# Patient Record
Sex: Male | Born: 1995 | Race: White | Hispanic: No | Marital: Single | Smoking: Never smoker
Health system: Southern US, Community
[De-identification: ages and names within clinical notes are randomized; demographics above are authoritative.]

## PROBLEM LIST (undated history)

## (undated) HISTORY — PX: ORIF WRIST FRACTURE: SHX2133

---
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
## 2015-02-02 ENCOUNTER — Emergency Department (HOSPITAL_COMMUNITY)
Admission: EM | Admit: 2015-02-02 | Discharge: 2015-02-03 | Disposition: A | Discharge status: Home / Self Care | Payer: Federal, State, Local not specified - PPO | Attending: Emergency Medicine | Admitting: Emergency Medicine

## 2015-02-02 ENCOUNTER — Encounter (HOSPITAL_COMMUNITY): Payer: Self-pay | Admitting: Emergency Medicine

## 2015-02-02 DIAGNOSIS — F1210 Cannabis abuse, uncomplicated: Secondary | ICD-10-CM | POA: Diagnosis not present

## 2015-02-02 DIAGNOSIS — Z720 Tobacco use: Secondary | ICD-10-CM | POA: Insufficient documentation

## 2015-02-02 DIAGNOSIS — F419 Anxiety disorder, unspecified: Secondary | ICD-10-CM | POA: Insufficient documentation

## 2015-02-02 DIAGNOSIS — F1410 Cocaine abuse, uncomplicated: Secondary | ICD-10-CM | POA: Insufficient documentation

## 2015-02-02 DIAGNOSIS — F1310 Sedative, hypnotic or anxiolytic abuse, uncomplicated: Secondary | ICD-10-CM | POA: Insufficient documentation

## 2015-02-02 DIAGNOSIS — F911 Conduct disorder, childhood-onset type: Secondary | ICD-10-CM | POA: Insufficient documentation

## 2015-02-02 DIAGNOSIS — F1020 Alcohol dependence, uncomplicated: Secondary | ICD-10-CM | POA: Insufficient documentation

## 2015-02-02 DIAGNOSIS — Z046 Encounter for general psychiatric examination, requested by authority: Secondary | ICD-10-CM | POA: Diagnosis present

## 2015-02-02 LAB — ETHANOL: ALCOHOL ETHYL (B): 128 mg/dL — AB (ref <5)

## 2015-02-02 LAB — COMPREHENSIVE METABOLIC PANEL
ALK PHOS: 112 U/L (ref 38–126)
ALT: 16 U/L — ABNORMAL LOW (ref 17–63)
AST: 21 U/L (ref 15–41)
Albumin: 4.3 g/dL (ref 3.5–5.0)
Anion gap: 11 (ref 5–15)
BUN: 11 mg/dL (ref 6–20)
CO2: 22 mmol/L (ref 22–32)
Calcium: 9 mg/dL (ref 8.9–10.3)
Chloride: 110 mmol/L (ref 101–111)
Creatinine, Ser: 1.21 mg/dL (ref 0.61–1.24)
GFR calc Af Amer: >60 mL/min (ref >60)
GFR calc non Af Amer: >60 mL/min (ref >60)
Glucose, Bld: 81 mg/dL (ref 65–99)
POTASSIUM: 3.7 mmol/L (ref 3.5–5.1)
Sodium: 143 mmol/L (ref 135–145)
TOTAL PROTEIN: 7.8 g/dL (ref 6.5–8.1)
Total Bilirubin: 1 mg/dL (ref 0.3–1.2)

## 2015-02-02 LAB — RAPID URINE DRUG SCREEN, HOSP PERFORMED
AMPHETAMINES: NOT DETECTED
Barbiturates: NOT DETECTED
Benzodiazepines: POSITIVE — AB
COCAINE: POSITIVE — AB
Opiates: NOT DETECTED
Tetrahydrocannabinol: POSITIVE — AB

## 2015-02-02 LAB — CBC
HCT: 42.4 % (ref 39.0–52.0)
Hemoglobin: 15.1 g/dL (ref 13.0–17.0)
MCH: 32.3 pg (ref 26.0–34.0)
MCHC: 35.6 g/dL (ref 30.0–36.0)
MCV: 90.6 fL (ref 78.0–100.0)
Platelets: 267 10*3/uL (ref 150–400)
RBC: 4.68 MIL/uL (ref 4.22–5.81)
RDW: 12.1 % (ref 11.5–15.5)
WBC: 8.8 10*3/uL (ref 4.0–10.5)

## 2015-02-02 LAB — ACETAMINOPHEN LEVEL: Acetaminophen (Tylenol), Serum: <10 ug/mL — ABNORMAL LOW (ref 10–30)

## 2015-02-02 LAB — SALICYLATE LEVEL: Salicylate Lvl: <4 mg/dL (ref 2.8–30.0)

## 2015-02-02 MED ORDER — ONDANSETRON HCL 4 MG PO TABS: 4.0000 mg | ORAL_TABLET | Freq: Three times a day (TID) | ORAL | Status: DC | PRN

## 2015-02-02 MED ORDER — ZIPRASIDONE MESYLATE 20 MG IM SOLR: 10.0000 mg | Freq: Once | INTRAMUSCULAR | Status: AC | PRN

## 2015-02-02 MED ORDER — ACETAMINOPHEN 325 MG PO TABS: 650.0000 mg | ORAL_TABLET | ORAL | Status: DC | PRN

## 2015-02-02 NOTE — ED Notes (Signed)
Pt brought in by sherriff  Paperwork is currently being taken out by sherriff  Parents called them today because pt took a knife out of a drawer in the kitchen and said he wanted to shoot himself  Pt denies wanting to hurt others but states he does want to hurt himself  Pt is on probation for a DUI  Pt had beer and xanax and another bottle of pills in his room  Parents want him to go to rehab but pt does not want to go   Pt admits to drinking 3 beers today  Pt has an appt with Dr Huston FoleyBrady on 7/13 at 11:15

## 2015-02-02 NOTE — BH Assessment (Addendum)
Tele Assessment Note   Luis George is an 19 y.o. single male who was brought to [facility] by [facility]. after a call from his parents regarding his aggressive behavior and suicidal statements made to them today. Pt is IVC'd.  Information for this assessment was obtained from [facility] notes and the pt.  Pt would not give consent to release or obtain information from his parents.  Pt stated that he was upset that his parents had called law enforcement as he is scared he is going to jail. As documented per parents, they reported to law enforcement  pt made suicidal statements to them including "I will get a gun and do it." Pt stated during the assessment that he would never actually kill himself but that he "hates his life" and that he is "not himself."  Pt would not give details on how/why he was not feeling himself or hates his life. Pt was displaying paranoid behavior and making paranoid statement throughout the assessment concerning "holding this information against me" and "this information being part of my permanent record." Pt reported that he has 3 charges related to substance use, one of which is a DUI for which he is on probation.  Pt was expressing tangential thinking throughout concerning how the assessment information was to be used and shared.  This assessor explained several times about his confidentiality rights and that there was no sharing of information without his consent (with specific exceptions none of which applied). Pt continually returned to the same questions and comments regarding these concerns. Pt reported that today he "became upset" at his parents' home without being willing to disclose what he was upset about. Pt reported that his father "addressed my behavior" and then "it escalated." Pt reported that he and his father "have no relationship" and that " he doesn't show affection to me like he does my siblings." Pt reported that he was also upset because he had broken up with his GF  "within the last few weeks" but would not discuss the reason for the breakup. Pt stated that though he broke up with his GF he still "has feelings for her" and this is adding to his upset state. Pt denies SI, HI, SHI and AVH.  Pt denies any abuse including physical, sexual or emotional/verbal. Pt states that he uses alcohol and marijuana regularly although he denies daily use of either.  Pt stated that he uses both "socially only." Pt stated that he uses Xanax which he states he is not prescribed "every once in a while" but could not identify a time frame for use.  Pt stated that he used cocaine today but that today was his first use. Today pt tested + for cannabis, benzos and cocaine on his UDS and ETOH = 128.   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 Pt reported that he is on the Dean's List at school. Pt stated that he has never been IP for MH reasons or had any OPT.  Pt reported that his parents had made him an appointment this coming Wednesday with a therapist to begin OPT. Pt reported that he has several charges related to substance use including one DUI and is currently on unsupervised probation. Pt states he gets 6 + hours of sleep at night and has not lost or gained any weight in the last few weeks.   Pt was alert, cooperative, polite and very tearful  during the assessment. Pt verbally challenged this assessor many times during the assessment and then, quickly voiced that he was cooperating and being polite.  Pt's speech was rapid and pressured.  Pt's movements were restless and somewhat iratic throughout.  Pt's thought processes while coherent and relevant also displayed tangential thinking as pt continually returned to paranoia over how the information he was giving would be used, whether or not he was going to jail and why I was asking the information for the assessment.  All of these topics were discussed with him in the  beginning of the assessment and repeatedly throughout whenever he would ask again. Pt's mood was depressed, anxious (paranoid) and irritable and his constricted, tearful, frightened affect was congruent. Pt was oriented x 4.   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 Moderate; 303.90 Alcohol Use Disorder, Moderate Axis II: Deferred Axis III: No past medical history on file. Axis IV: other psychosocial or environmental problems, problems related to legal system/crime, problems related to social environment and problems with primary support group Axis V: 11-20 some danger of hurting self or others possible OR occasionally fails to maintain minimal personal hygiene OR gross impairment in communication  Past Medical History: No past medical history on file.  Past Surgical History  Procedure Laterality Date  . Orif wrist fracture      Family History: History reviewed. No pertinent family history.  Social History:  reports that he has been smoking Cigarettes.  He does not have any smokeless tobacco history on file. He reports that he drinks alcohol. He reports that he uses illicit drugs.  Additional Social History:  Alcohol / Drug Use Prescriptions: See PTA list History of alcohol / drug use?: Yes (Pt was very paranoid and difficult to obtain information from due to his paranoia) Longest period of sobriety (when/how long): "a long time" pt could not give specific time frame Substance #1 Name of Substance 1: Alcohol 1 - Age of First Use: 15 1 - Amount (size/oz): 3 beers or more 1 - Frequency: 2-3 times a month / socially 1 - Duration: since 15 1 - Last Use / Amount: today Substance #2 Name of Substance 2: Marijuana 2 - Age of First Use: "high school" 2 - Amount (size/oz): unknown- pt would not give amt 2 - Frequency: "I use it socially.  I cannot tell you exactly when" 2 - Duration: since high school 2 - Last Use /  Amount: Today Substance #3 Name of Substance 3: Cocaine 3 - Age of First Use: 18 3 - Amount (size/oz): unknown to pt 3 - Frequency: first time per pt 3 - Duration: today only per pt 3 - Last Use / Amount: today Substance #4 Name of Substance 4: Xanax 4 - Age of First Use: unknown- pt would not answer 4 - Amount (size/oz): unknown 4 - Frequency: unknown 4 - Duration: unknown 4 - Last Use / Amount: today  CIWA: CIWA-Ar BP: 122/60 mmHg Pulse Rate: 98 COWS:    PATIENT STRENGTHS: (choose at least two) Average or above average intelligence Capable of independent living Communication skills Physical Health Supportive family/friends  Allergies: No Known Allergies  Home Medications:  (Not in a hospital admission)  OB/GYN Status:  No LMP for male patient.  General Assessment Data Location of Assessment: [facility] ED TTS Assessment: In system Is this a Tele or Face-to-Face Assessment?: Tele Assessment Is this an Initial Assessment or a Re-assessment for this encounter?: Initial Assessment Marital  status: Single Maiden name: na Is patient pregnant?: No Pregnancy Status: No Living Arrangements: Parent (Parents in summer; College during school year) Can pt return to current living arrangement?: Yes Admission Status: Involuntary Is patient capable of signing voluntary admission?:  (Pt is IVC) Referral Source: Self/Family/Friend Insurance type: Scientist, research (physical sciences)BCBS  Medical Screening Exam [facility] Walk-in ONLY) Medical Exam completed: Yes  Crisis Care Plan Living Arrangements: Parent (Parents in summer; College during school year) Name of Psychiatrist: none Name of Therapist: none  Education Status Is patient currently in school?: Yes Current Grade:  (college) Highest grade of school patient has completed: 12 (plus some college) Name of school: [employer] Contact person: na  Risk to self with the past 6 months Suicidal Ideation: Yes-Currently Present Has patient been a risk to self  within the past 6 months prior to admission? : Yes Suicidal Intent: Yes-Currently Present Has patient had any suicidal intent within the past 6 months prior to admission? : Yes Is patient at risk for suicide?: Yes Suicidal Plan?: No (denies a plan) Has patient had any suicidal plan within the past 6 months prior to admission? : No Access to Means:  (unknown pt would not answer) What has been your use of drugs/alcohol within the last 12 months?: regular use of multiple substances Previous Attempts/Gestures: No (denies) How many times?: 0 Other Self Harm Risks: none noted Triggers for Past Attempts:  (na) Intentional Self Injurious Behavior: None Family Suicide History: Unknown Recent stressful life event(s): Conflict (Comment), Loss (Comment) (loss of GF; Conflict with Dad) Persecutory voices/beliefs?: No (denies) Depression: Yes Depression Symptoms: Tearfulness, Fatigue, Guilt, Loss of interest in usual pleasures, Feeling worthless/self pity, Feeling angry/irritable Substance abuse history and/or treatment for substance abuse?: Yes Suicide prevention information given to non-admitted patients: Not applicable  Risk to Others within the past 6 months Homicidal Ideation: No (denies) Does patient have any lifetime risk of violence toward others beyond the six months prior to admission? : No Thoughts of Harm to Others: No (denies) Current Homicidal Intent: No (denies) Current Homicidal Plan: No (denies) Access to Homicidal Means:  (unknown ) Identified Victim: na History of harm to others?: No (denies) Assessment of Violence: None Noted Violent Behavior Description: na Does patient have access to weapons?:  (unknown) Criminal Charges Pending?: No Does patient have a court date: Yes Is patient on probation?: Yes (for DUI and other substance related charges)  Psychosis Hallucinations: None noted Delusions: None noted  Mental Status Report Appearance/Hygiene: In scrubs,  Unremarkable Eye Contact: Good Motor Activity: Agitation, Restlessness Speech: Rapid, Pressured, Logical/coherent, Loud Level of Consciousness: Alert, Restless, Crying (Argumentative; Paranoid) Mood: Depressed, Anxious, Suspicious, Apprehensive, Ashamed/humiliated, Irritable, Terrified, Fearful Affect: Irritable, Constricted, Anxious, Apprehensive, Depressed, Frightened Anxiety Level: Moderate Thought Processes: Coherent, Relevant, Tangential (thoughts continually going to whether he was going to jail) Judgement: Impaired Orientation: Person, Place, Time, Situation Obsessive Compulsive Thoughts/Behaviors: None  Cognitive Functioning Concentration: Poor Memory: Recent Impaired, Remote Intact IQ: Average Insight: Poor Impulse Control: Poor Appetite: Good Weight Loss: 0 Weight Gain: 0 Sleep: No Change Total Hours of Sleep: 6 Vegetative Symptoms: None  ADLScreening [facility] Assessment Services) Patient's cognitive ability adequate to safely complete daily activities?: Yes Patient able to express need for assistance with ADLs?: Yes Independently performs ADLs?: Yes (appropriate for developmental age)  Prior Inpatient Therapy Prior Inpatient Therapy: No Prior Therapy Dates: na Prior Therapy Facilty/Provider(s): na Reason for Treatment: na  Prior Outpatient Therapy Prior Outpatient Therapy: No Prior Therapy Dates: na Prior Therapy Facilty/Provider(s): na Reason for Treatment:  na Does patient have an ACCT team?: No Does patient have Intensive In-House Services?  : No Does patient have [facility] services? : No Does patient have P4CC services?: No  ADL Screening (condition at time of admission) Patient's cognitive ability adequate to safely complete daily activities?: Yes Patient able to express need for assistance with ADLs?: Yes Independently performs ADLs?: Yes (appropriate for developmental age)       Abuse/Neglect Assessment (Assessment to be complete while patient is  alone) Physical Abuse: Denies Verbal Abuse: Denies Sexual Abuse: Denies     Advance Directives (For Healthcare) Does patient have an advance directive?: No Would patient like information on creating an advanced directive?: No - patient declined information    Additional Information 1:1 In Past 12 Months?: No CIRT Risk: No Elopement Risk: No Does patient have medical clearance?: Yes     Disposition:  Disposition Initial Assessment Completed for this Encounter: Yes Disposition of Patient: Other dispositions (Pending review w [facility] Extender) Other disposition(s): Other (Comment)  Per Donell Sievert, PA: Meets IP criteiria. Dual Diagnosis (SA and MDD Per Clint Bolder, [facility]: No appropriate beds at [facility]; TTS will seek outside placement.   Spoke with Elpidio Anis, PA-C: Advised of Recommendation.  She agreed.   Beryle Flock, MS, CRC, [facility] Triage Specialist [facility] T 02/02/2015 10:27 PM

## 2015-02-02 NOTE — ED Notes (Signed)
Telepsych in progress. 

## 2015-02-02 NOTE — ED Provider Notes (Signed)
CSN: [id number]     Arrival date & time 02/02/15  1957 History  This chart was scribed for non-physician practitioner, Elpidio Anis, PA-C, working with Elwin Mocha, MD by Evon Slack, ED Scribe. This patient was seen in room WTR4/WLPT4 and the patient's care was started at 8:43 PM.    Chief Complaint  Patient presents with  . IVC   . Suicidal   The history is provided by the patient. No language interpreter was used.  HPI Comments: Luis George is a 19 y.o. male brought in by [employer] [employer], who presents to the Emergency under IVC paperwork for aggressive behavior and suicide statements. IVC paperwork implemented by [employer] after being called to the home for the second time with complaints of aggressive behavior towards his parents. Complaint by parents of alcohol dependence issue. Today pt has been makins suicidal statements "I will get a gun and do it."   No past medical history on file. Past Surgical History  Procedure Laterality Date  . Orif wrist fracture     History reviewed. No pertinent family history. History  Substance Use Topics  . Smoking status: Current Some Day Smoker    Types: Cigarettes  . Smokeless tobacco: Not on file  . Alcohol Use: Yes    Review of Systems  All other systems reviewed and are negative.    Allergies  Review of patient's allergies indicates no known allergies.  Home Medications   Prior to Admission medications   Medication Sig Start Date End Date Taking? Authorizing Provider  diphenhydrAMINE (BENADRYL) 25 mg capsule Take 25 mg by mouth daily as needed for allergies.   Yes Historical Provider, MD   BP 122/60 mmHg  Pulse 98  Temp(Src) 98 F (36.7 C) (Oral)  Resp 20  SpO2 100%   Physical Exam  Constitutional: He is oriented to person, place, and time. He appears well-developed and well-nourished. No distress.  HENT:  Head: Normocephalic and atraumatic.  Eyes: Conjunctivae and EOM are normal.  Neck: Neck  supple. No tracheal deviation present.  Cardiovascular: Normal rate, regular rhythm and normal heart sounds.   Pulmonary/Chest: Effort normal and breath sounds normal. No respiratory distress. He has no wheezes. He has no rales.  Abdominal: Soft. There is no tenderness.  Musculoskeletal: Normal range of motion.  Neurological: He is alert and oriented to person, place, and time.  Skin: Skin is warm and dry.  Psychiatric: His speech is normal. His mood appears anxious. He is agitated. He expresses suicidal ideation. He expresses no homicidal ideation.  Nursing note and vitals reviewed.   ED Course  Procedures (including critical care time) DIAGNOSTIC STUDIES: Oxygen Saturation is 100% on RA, normal by my interpretation.    COORDINATION OF CARE:    Labs Review Labs Reviewed  URINE RAPID DRUG SCREEN, HOSP PERFORMED - Abnormal; Notable for the following:    Cocaine POSITIVE (*)    Benzodiazepines POSITIVE (*)    Tetrahydrocannabinol POSITIVE (*)    All other components within normal limits  CBC  COMPREHENSIVE METABOLIC PANEL  ETHANOL  SALICYLATE LEVEL  ACETAMINOPHEN LEVEL    Imaging Review No results found.   EKG Interpretation None      MDM   Final diagnoses:  None   1. Suicidal ideation, under IVC 2. Aggression 2. Polysubstance abuse  IVC petition in place. Waiting for TTS consultation to determine disposition.   I personally performed the services described in this documentation, which was scribed in my presence. The recorded information  has been reviewed and is accurate.        Elpidio AnisShari Katreena Schupp, PA-C 02/02/15 2125  Elwin MochaBlair Walden, MD 02/02/15 [phone]

## 2015-02-03 ENCOUNTER — Inpatient Hospital Stay (HOSPITAL_COMMUNITY)
Admission: AD | Admit: 2015-02-03 | Discharge: 2015-02-07 | DRG: 881 | Disposition: A | Discharge status: Home / Self Care | Payer: Federal, State, Local not specified - PPO | Source: Intra-hospital | Attending: Emergency Medicine | Admitting: Emergency Medicine

## 2015-02-03 ENCOUNTER — Encounter (HOSPITAL_COMMUNITY): Payer: Self-pay | Admitting: Emergency Medicine

## 2015-02-03 DIAGNOSIS — R45851 Suicidal ideations: Secondary | ICD-10-CM | POA: Diagnosis present

## 2015-02-03 DIAGNOSIS — F1910 Other psychoactive substance abuse, uncomplicated: Secondary | ICD-10-CM | POA: Diagnosis present

## 2015-02-03 DIAGNOSIS — R569 Unspecified convulsions: Secondary | ICD-10-CM | POA: Diagnosis present

## 2015-02-03 DIAGNOSIS — F1994 Other psychoactive substance use, unspecified with psychoactive substance-induced mood disorder: Secondary | ICD-10-CM | POA: Diagnosis present

## 2015-02-03 DIAGNOSIS — F419 Anxiety disorder, unspecified: Secondary | ICD-10-CM | POA: Diagnosis present

## 2015-02-03 DIAGNOSIS — F1020 Alcohol dependence, uncomplicated: Secondary | ICD-10-CM | POA: Diagnosis present

## 2015-02-03 DIAGNOSIS — F4323 Adjustment disorder with mixed anxiety and depressed mood: Secondary | ICD-10-CM | POA: Diagnosis present

## 2015-02-03 DIAGNOSIS — F329 Major depressive disorder, single episode, unspecified: Secondary | ICD-10-CM | POA: Diagnosis not present

## 2015-02-03 DIAGNOSIS — R55 Syncope and collapse: Secondary | ICD-10-CM

## 2015-02-03 MED ORDER — TRAZODONE HCL 50 MG PO TABS
50.0000 mg | ORAL_TABLET | Freq: Every evening | ORAL | Status: DC | PRN
  Filled 2015-02-03: qty 1

## 2015-02-03 MED ORDER — ADULT MULTIVITAMIN W/MINERALS CH: 1.0000 | ORAL_TABLET | Freq: Every day | ORAL | Status: DC

## 2015-02-03 MED ORDER — ONDANSETRON 4 MG PO TBDP: 4.0000 mg | ORAL_TABLET | Freq: Four times a day (QID) | ORAL | Status: DC | PRN

## 2015-02-03 MED ORDER — LORAZEPAM 1 MG PO TABS: 1.0000 mg | ORAL_TABLET | Freq: Four times a day (QID) | ORAL | Status: DC | PRN

## 2015-02-03 MED ORDER — ALUM & MAG HYDROXIDE-SIMETH 200-200-20 MG/5ML PO SUSP: 30.0000 mL | ORAL | Status: DC | PRN

## 2015-02-03 MED ORDER — LOPERAMIDE HCL 2 MG PO CAPS: 2.0000 mg | ORAL_CAPSULE | ORAL | Status: DC | PRN

## 2015-02-03 MED ORDER — VITAMIN B-1 100 MG PO TABS: 100.0000 mg | ORAL_TABLET | Freq: Every day | ORAL | Status: DC

## 2015-02-03 MED ORDER — THIAMINE HCL 100 MG/ML IJ SOLN: 100.0000 mg | Freq: Once | INTRAMUSCULAR | Status: DC

## 2015-02-03 MED ORDER — CHLORDIAZEPOXIDE HCL 25 MG PO CAPS: 25.0000 mg | ORAL_CAPSULE | Freq: Four times a day (QID) | ORAL | Status: DC | PRN

## 2015-02-03 MED ORDER — MAGNESIUM HYDROXIDE 400 MG/5ML PO SUSP: 30.0000 mL | Freq: Every day | ORAL | Status: DC | PRN

## 2015-02-03 MED ORDER — HYDROXYZINE HCL 25 MG PO TABS: 25.0000 mg | ORAL_TABLET | Freq: Four times a day (QID) | ORAL | Status: DC | PRN

## 2015-02-03 MED ORDER — ACETAMINOPHEN 325 MG PO TABS: 650.0000 mg | ORAL_TABLET | Freq: Four times a day (QID) | ORAL | Status: DC | PRN

## 2015-02-03 MED ORDER — HYDROXYZINE HCL 25 MG PO TABS
25.0000 mg | ORAL_TABLET | Freq: Four times a day (QID) | ORAL | Status: DC | PRN
Start: 2015-02-03 — End: 2015-02-06
  Administered 2015-02-05: 25 mg via ORAL
  Filled 2015-02-03: qty 1

## 2015-02-03 NOTE — BH Assessment (Signed)
BHH Assessment Progress Note  Per Thedore MinsMojeed Akintayo, MD, this pt requires psychiatric hospitalization at this time.  Berneice Heinrichina Tate, RN, [facility].  This pt is under IVC and commitment paperwork [facility]  Pt's nurse has been notified, and agrees to call report to [phone].  [facility].  Doylene Canninghomas Cathlyn Tersigni, MA Triage Specialist [phone]

## 2015-02-03 NOTE — BHH Counselor (Signed)
This patient's information was faxed out to the following facilities for review:  [facility]    Vale Peraza [place], [place] Counselor

## 2015-02-03 NOTE — Consult Note (Signed)
[facility] Psychiatry Consult   Reason for Consult:  Alcohol use disorder, Moderate, Suicidal ideation Referring Physician:  EDP Patient Identification: Luis George MRN:  [id number] Principal Diagnosis: Alcohol use disorder, severe, dependence Diagnosis:   Patient Active Problem List   Diagnosis Date Noted  . Alcohol use disorder, severe, dependence [F10.20] 02/03/2015    Priority: High    Total Time spent with patient: 45 minutes  Subjective:   Luis George is a 19 y.o. male patient admitted with ** Alcohol use disorder, moderate, Suicidal ideation. , HPI:  Caucasian male, 19 years old was evaluated for Alcohol use disorder and suicidal ideation with a knife to cut himself.  Patient was IVC by his parents and is under Probation for DUI.  Patient admitted to using Alcohol, Benzodiazepine, Cocaine and Marijuana.  His UDS is positive for Cocaine, Marijuana and Benzodiazepine and his Alcohol level was 128.  Patient denied wanting to kill himself or anybody but admitted to grabbing a knife.  He admitted to using Xanax bought off the street.  He reports breaking up with his girl friend and have been dealing with the situation with Alcohol and other substances.  Patient has been accepted at [facility] and has a bed assigned.  He will be transferred as soon as soon as transportation is available.  HPI Elements:   Location:  Alcohol use disorder, moderate, Suicidal ideation, . Quality:  severe. Severity:  severe. Timing:  acute. Duration:  sudden. Context:  IVC by his parents,.  Past Medical History: No past medical history on file.  Past Surgical History  Procedure Laterality Date  . Orif wrist fracture     Family History: History reviewed. No pertinent family history. Social History:  History  Alcohol Use  . Yes     History  Drug Use  . Yes    Comment: xanax    History   Social History  . Marital Status: Single    Spouse Name: N/A  . Number of Children: N/A  . Years of  Education: N/A   Social History Main Topics  . Smoking status: Current Some Day Smoker    Types: Cigarettes  . Smokeless tobacco: Not on file  . Alcohol Use: Yes  . Drug Use: Yes     Comment: xanax  . Sexual Activity: Not on file   Other Topics Concern  . None   Social History Narrative   Additional Social History:    Prescriptions: See PTA list History of alcohol / drug use?: Yes (Pt was very paranoid and difficult to obtain information from due to his paranoia) Longest period of sobriety (when/how long): "a long time" pt could not give specific time frame Name of Substance 1: Alcohol 1 - Age of First Use: 15 1 - Amount (size/oz): 3 beers or more 1 - Frequency: 2-3 times a month / socially 1 - Duration: since 15 1 - Last Use / Amount: today Name of Substance 2: Marijuana 2 - Age of First Use: "high school" 2 - Amount (size/oz): unknown- pt would not give amt 2 - Frequency: "I use it socially.  I cannot tell you exactly when" 2 - Duration: since high school 2 - Last Use / Amount: Today Name of Substance 3: Cocaine 3 - Age of First Use: 18 3 - Amount (size/oz): unknown to pt 3 - Frequency: first time per pt 3 - Duration: today only per pt 3 - Last Use / Amount: today Name of Substance 4: Xanax 4 - Age of  First Use: unknown- pt would not answer 4 - Amount (size/oz): unknown 4 - Frequency: unknown 4 - Duration: unknown 4 - Last Use / Amount: today             Allergies:  No Known Allergies  Labs:  Results for orders placed or performed during the hospital encounter of 02/02/15 (from the past 48 hour(s))  Urine rapid drug screen (hosp performed)     Status: Abnormal   Collection Time: 02/02/15  8:25 PM  Result Value Ref Range   Opiates NONE DETECTED NONE DETECTED   Cocaine POSITIVE (A) NONE DETECTED   Benzodiazepines POSITIVE (A) NONE DETECTED   Amphetamines NONE DETECTED NONE DETECTED   Tetrahydrocannabinol POSITIVE (A) NONE DETECTED   Barbiturates NONE  DETECTED NONE DETECTED    Comment:        DRUG SCREEN FOR MEDICAL PURPOSES ONLY.  IF CONFIRMATION IS NEEDED FOR ANY PURPOSE, NOTIFY LAB WITHIN 5 DAYS.        LOWEST DETECTABLE LIMITS FOR URINE DRUG SCREEN Drug Class       Cutoff (ng/mL) Amphetamine      1000 Barbiturate      200 Benzodiazepine   163 Tricyclics       845 Opiates          300 Cocaine          300 THC              50   Comprehensive metabolic panel     Status: Abnormal   Collection Time: 02/02/15  8:36 PM  Result Value Ref Range   Sodium 143 135 - 145 mmol/L   Potassium 3.7 3.5 - 5.1 mmol/L   Chloride 110 101 - 111 mmol/L   CO2 22 22 - 32 mmol/L   Glucose, Bld 81 65 - 99 mg/dL   BUN 11 6 - 20 mg/dL   Creatinine, Ser 1.21 0.61 - 1.24 mg/dL   Calcium 9.0 8.9 - 10.3 mg/dL   Total Protein 7.8 6.5 - 8.1 g/dL   Albumin 4.3 3.5 - 5.0 g/dL   AST 21 15 - 41 U/L   ALT 16 (L) 17 - 63 U/L   Alkaline Phosphatase 112 38 - 126 U/L   Total Bilirubin 1.0 0.3 - 1.2 mg/dL   GFR calc non Af Amer >60 >60 mL/min   GFR calc Af Amer >60 >60 mL/min    Comment: (NOTE) The eGFR has been calculated using the CKD EPI equation. This calculation has not been validated in all clinical situations. eGFR's persistently <60 mL/min signify possible Chronic Kidney Disease.    Anion gap 11 5 - 15  Ethanol (ETOH)     Status: Abnormal   Collection Time: 02/02/15  8:36 PM  Result Value Ref Range   Alcohol, Ethyl (B) 128 (H) <5 mg/dL    Comment:        LOWEST DETECTABLE LIMIT FOR SERUM ALCOHOL IS 5 mg/dL FOR MEDICAL PURPOSES ONLY   Salicylate level     Status: None   Collection Time: 02/02/15  8:36 PM  Result Value Ref Range   Salicylate Lvl <3.6 2.8 - 30.0 mg/dL  Acetaminophen level     Status: Abnormal   Collection Time: 02/02/15  8:36 PM  Result Value Ref Range   Acetaminophen (Tylenol), Serum <10 (L) 10 - 30 ug/mL    Comment:        THERAPEUTIC CONCENTRATIONS VARY SIGNIFICANTLY. A RANGE OF 10-30 ug/mL MAY BE AN  EFFECTIVE CONCENTRATION  FOR MANY PATIENTS. HOWEVER, SOME ARE BEST TREATED AT CONCENTRATIONS OUTSIDE THIS RANGE. ACETAMINOPHEN CONCENTRATIONS >150 ug/mL AT 4 HOURS AFTER INGESTION AND >50 ug/mL AT 12 HOURS AFTER INGESTION ARE OFTEN ASSOCIATED WITH TOXIC REACTIONS.   CBC     Status: None   Collection Time: 02/02/15  8:36 PM  Result Value Ref Range   WBC 8.8 4.0 - 10.5 K/uL   RBC 4.68 4.22 - 5.81 MIL/uL   Hemoglobin 15.1 13.0 - 17.0 g/dL   HCT 42.4 39.0 - 52.0 %   MCV 90.6 78.0 - 100.0 fL   MCH 32.3 26.0 - 34.0 pg   MCHC 35.6 30.0 - 36.0 g/dL   RDW 12.1 11.5 - 15.5 %   Platelets 267 150 - 400 K/uL    Vitals: Blood pressure 123/73, pulse 105, temperature 97.5 F (36.4 C), temperature source Oral, resp. rate 18, SpO2 100 %.  Risk to Self: Suicidal Ideation: Yes-Currently Present Suicidal Intent: Yes-Currently Present Is patient at risk for suicide?: Yes Suicidal Plan?: No (denies a plan) Access to Means:  (unknown pt would not answer) What has been your use of drugs/alcohol within the last 12 months?: regular use of multiple substances How many times?: 0 Other Self Harm Risks: none noted Triggers for Past Attempts:  (na) Intentional Self Injurious Behavior: None Risk to Others: Homicidal Ideation: No (denies) Thoughts of Harm to Others: No (denies) Current Homicidal Intent: No (denies) Current Homicidal Plan: No (denies) Access to Homicidal Means:  (unknown ) Identified Victim: na History of harm to others?: No (denies) Assessment of Violence: None Noted Violent Behavior Description: na Does patient have access to weapons?:  (unknown) Criminal Charges Pending?: No Does patient have a court date: Yes Prior Inpatient Therapy: Prior Inpatient Therapy: No Prior Therapy Dates: na Prior Therapy Facilty/Provider(s): na Reason for Treatment: na Prior Outpatient Therapy: Prior Outpatient Therapy: No Prior Therapy Dates: na Prior Therapy Facilty/Provider(s): na Reason for  Treatment: na Does patient have an ACCT team?: No Does patient have Intensive In-House Services?  : No Does patient have [facility] services? : No Does patient have P4CC services?: No  Current Facility-Administered Medications  Medication Dose Route Frequency Provider Last Rate Last Dose  . acetaminophen (TYLENOL) tablet 650 mg  650 mg Oral Q4H PRN Charlann Lange, PA-C      . ondansetron (ZOFRAN) tablet 4 mg  4 mg Oral Q8H PRN Charlann Lange, PA-C       Current Outpatient Prescriptions  Medication Sig Dispense Refill  . diphenhydrAMINE (BENADRYL) 25 mg capsule Take 25 mg by mouth daily as needed for allergies.      Musculoskeletal: Strength & Muscle Tone: within normal limits Gait & Station: normal Patient leans: N/A  Psychiatric Specialty Exam: Physical Exam  Review of Systems  Constitutional: Negative.   HENT: Negative.   Eyes: Negative.   Respiratory: Negative.   Cardiovascular: Negative.   Gastrointestinal: Negative.   Genitourinary: Negative.   Skin: Negative.   Neurological: Negative.   Endo/Heme/Allergies: Negative.     Blood pressure 123/73, pulse 105, temperature 97.5 F (36.4 C), temperature source Oral, resp. rate 18, SpO2 100 %.There is no height or weight on file to calculate BMI.  General Appearance: Casual and Fairly Groomed  Engineer, water::  Good  Speech:  Clear and Coherent and Normal Rate  Volume:  Normal  Mood:  Angry and Anxious  Affect:  Congruent  Thought Process:  Coherent, Goal Directed and Intact  Orientation:  Full (Time, Place, and Person)  Thought Content:  WDL  Suicidal Thoughts:  No  Homicidal Thoughts:  No  Memory:  Immediate;   Good Recent;   Good Remote;   Good  Judgement:  Impaired  Insight:  Shallow  Psychomotor Activity:  Psychomotor Retardation  Concentration:  Poor  Recall:  NA  Fund of Knowledge:Poor  Language: Good  Akathisia:  NA  Handed:  Right  AIMS (if indicated):     Assets:  Desire for Improvement  ADL's:  Intact   Cognition: WNL  Sleep:      Medical Decision Making: Review of Psycho-Social Stressors (1)  Treatment Plan Summary: Daily contact with patient to assess and evaluate symptoms and progress in treatment and Medication management  Plan:  Transfer to [facility], Use Ativan detox protocol for detox. Disposition:  Admitted to [facility]  Delfin Gant 02/03/2015 4:59 PM Patient seen face-to-face for psychiatric evaluation, chart reviewed and case discussed with the physician extender and developed treatment plan. Reviewed the information documented and agree with the treatment plan. Corena Pilgrim, MD

## 2015-02-03 NOTE — Progress Notes (Signed)
Patient ID: Luis George, male   DOB: 01/07/1996, 19 y.o.   MRN: [id number]  Pt is alert and oriented, and appears anxious about being here. He does not think that he needs to be here. He got into an argument with his father and, "It blew up and they were worried bout me."  He denies wanting to kill himself then, or now. He admits that he has "bad anxiety" and a recent break-up with a girlfriend. Pt attends college at [employer]. He drinks socially, has smoked cigarettes on occasion, but does not smoke daily. He has used Cocaine once in the past several years. He does not think that he is addicted, but he does have a DWI. Pt states that he has a close, supportive, family.  The ED nurse reported that pt took a knife out of a drawer and told his parents that he wanted to shoot himself.  She said that his father visited while pt was in the ED, and pt was crying after he left.   Oriented to the unit; Education provided about safety on the unit, including fall prevention. Nutrition offered. Safety checks initiated every 15 minutes.

## 2015-02-03 NOTE — Progress Notes (Signed)
Pt refers frequently to his parents [place] in [place] in [place] Pt states he is home for the summer Pt states he will use information provided for "future reference"  [facility] ED CM spoke with pt on how to obtain an in network pcp with insurance coverage via the customer service number or web site  Cm reviewed ED level of care for crisis/emergent services and community pcp level of care to manage continuous or chronic medical concerns.  The pt voiced understanding CM encouraged pt and discussed pt's responsibility to verify with pt's insurance carrier that any recommended medical provider offered by any emergency room or a hospital provider is within the carrier's network. The pt voiced understanding

## 2015-02-03 NOTE — Progress Notes (Signed)
Pt attended the evening AA speaker meeting. Pt was engaged and appropriate. 

## 2015-02-03 NOTE — Progress Notes (Signed)
Pt is being reviewed by [facility] for potential placement.   Shatina Streets, MSW, LCSW, LCAS Clinical Social Worker [phone] 

## 2015-02-03 NOTE — BH Assessment (Signed)
[facility] pt has been accepted to the wait list. A bed should be available later today (02/03/15)  Clista BernhardtNancy Echo [place], [facility] Triage Specialist 02/03/2015 5:47 AM

## 2015-02-04 ENCOUNTER — Encounter (HOSPITAL_COMMUNITY): Payer: Self-pay | Admitting: Psychiatry

## 2015-02-04 DIAGNOSIS — F1994 Other psychoactive substance use, unspecified with psychoactive substance-induced mood disorder: Secondary | ICD-10-CM

## 2015-02-04 DIAGNOSIS — F4323 Adjustment disorder with mixed anxiety and depressed mood: Secondary | ICD-10-CM

## 2015-02-04 NOTE — BHH Suicide Risk Assessment (Signed)
[facility] Admission Suicide Risk Assessment   Nursing information obtained from:  Patient Demographic factors:  Male, Adolescent or young adult, Caucasian Current Mental Status:  Suicidal ideation indicated by others, Self-harm thoughts, Self-harm behaviors Loss Factors:  Loss of significant relationship Historical Factors:  NA Risk Reduction Factors:  Sense of responsibility to family, Living with another person, especially a relative, Positive social support Total Time spent with patient: 45 minutes Principal Problem: Substance induced mood disorder Diagnosis:   Patient Active Problem List   Diagnosis Date Noted  . Substance induced mood disorder [F19.94] 02/04/2015  . Polysubstance abuse [F19.10] 02/03/2015     Continued Clinical Symptoms:  Alcohol Use Disorder Identification Test Final Score (AUDIT): 7 The "Alcohol Use Disorders Identification Test", Guidelines for Use in Primary Care, Second Edition.  World Science writerHealth Organization [facility](WHO). Score between 0-7:  no or low risk or alcohol related problems. Score between 8-15:  moderate risk of alcohol related problems. Score between 16-19:  high risk of alcohol related problems. Score 20 or above:  warrants further diagnostic evaluation for alcohol dependence and treatment.   CLINICAL FACTORS:   Depression:   Comorbid alcohol abuse/dependence Impulsivity  Psychiatric Specialty Exam: Physical Exam  ROS  Blood pressure 118/89, pulse 85, temperature 97.5 F (36.4 C), resp. rate 16.There is no height or weight on file to calculate BMI.    COGNITIVE FEATURES THAT CONTRIBUTE TO RISK:  None    SUICIDE RISK:   Mild:  Suicidal ideation of limited frequency, intensity, duration, and specificity.  There are no identifiable plans, no associated intent, mild dysphoria and related symptoms, good self-control (both objective and subjective assessment), few other risk factors, and identifiable protective factors, including available and accessible  social support.  PLAN OF CARE: supportive approach/coping skills                               Substance abuse; determine detox needs                                Reassess and address the co morbities  Medical Decision Making:  Review of Psycho-Social Stressors (1), Review or order clinical lab tests (1) and Review of Medication Regimen & Side Effects (2)  I certify that inpatient services furnished can reasonably be expected to improve the patient's condition.   Delany Steury A 02/04/2015, 2:03 PM

## 2015-02-04 NOTE — BHH Counselor (Signed)
Adult Comprehensive Assessment  Patient ID: Luis George, male   DOB: 1996-02-24, 19 y.o.   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) Family Relationships:  (mom- good support, dad- "argue a lot", siblings- good support) Surveyor, quantityinancial / Lack of resources (include bankruptcy):  (denies) Housing / Lack of housing:  (denies- living with parents over the summer and in  apartment at school) Physical health (include injuries & life threatening diseases):  (denies) Social relationships:  (several "volatile" situations with close friends recently causing extreme distress and anxiety) Substance abuse:  (some etoh use; occasional THC and recent (one time ) cocaine use per his report) Bereavement / Loss:  ("break up" with GF and several close friends )  Living/Environment/Situation:  Living Arrangements: Parent Living conditions (as described by patient or guardian):  (living with parents over the summer with plans to return to campus housing in the fall) How long has patient lived in current situation?:  (whole life with parents) What is atmosphere in current home: Other (Comment) (patient reports a lot of arguing with dad- otherwise, good enviornment)  Family History:  Marital status: Single Does patient have children?: No  Childhood History:  By whom was/is the patient raised?: Both parents Additional childhood history information:  (positive- argued a lot with dad growing up; "dad doesn't know how to show his emotions and I'm an emotional person") Description of patient's relationship with caregiver when they were a child:  (good) Patient's description of current relationship with people who raised him/her:  (supportive parents "they love me" but lots of anxiety related to dad and their arguments) Does patient have siblings?:  Yes Number of Siblings: 2 Description of patient's current relationship with siblings:  (both siblings are supportive and understanding of his needs/issues-  "I am a good support to my brother when he has issues with his gifrlfiriend") Did patient suffer any verbal/emotional/physical/sexual abuse as a child?: No Did patient suffer from severe childhood neglect?: No Has patient ever been sexually abused/assaulted/raped as an adolescent or adult?: No Was the patient ever a victim of a crime or a disaster?: No Witnessed domestic violence?: No Has patient been effected by domestic violence as an adult?: No  Education:  Highest grade of school patient has completed:  (Health and safety inspectorrising Junior  in college) Currently a Consulting civil engineerstudent?: Yes Name of school: [employer]  How long has the patient attended?: completed 2 years Learning disability?: No  Employment/Work Situation:   Employment situation: Employed 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 patient been employed?: summer job Patient's job has been impacted by current illness: Yes Describe how patient's job has been impacted:  ( concerned about missing work this week while hospitalized- offered to write a work note for him if needed) What is the longest time patient has a held a job?:  (summer jobs) Has patient ever been in the [employer] and Companymilitary?: No Has patient ever served in Buyer, retailcombat?: No  Financial Resources:   Financial resources: Income from employment Does patient have a representative payee or guardian?: No  Alcohol/Substance Abuse:   What has been your use of drugs/alcohol within the last 12 months?:  (occassional etoh and THC use- one time use of cocaine this week) If attempted suicide, did drugs/alcohol play a role in this?: No Alcohol/Substance Abuse Treatment Hx: Denies past history Has alcohol/substance abuse ever caused legal problems?: No  Social Support System:  Patient's Community Support System: Location manager System:  (parents, siblings, friends) Type of faith/religion: "religious but not active" How does patient's faith help to cope with current illness?: prayer helps  Leisure/Recreation:   Leisure and Hobbies: exercise, soccer, running  Strengths/Needs:   What things does the patient do well?: school, support system, hardworker, good ethics In what areas does patient struggle / problems for patient: emotions, anxiety, worry  Discharge Plan:   Does patient have access to transportation?: Yes Will patient be returning to same living situation after discharge?: Yes Currently receiving community mental health services: No If no, would patient like referral for services when discharged?: Yes (What county?) ([place] and/or [place], [place]) Does patient have financial barriers related to discharge medications?: No  Summary/Recommendations:    Patient was pleasant and engaged throughout assessment. He showed signs of extreme anxiety and worry as well becoming tearful at times. "I haven't even cried since I got here". He reports having a lot of anxiety and worry related to a recent break up (he broke up with his GF of 1 year because of arguing all the time), several close friends becoming upset and accusing him of things based on a picture on social media and an argument with his father.  Patient admits to being "embarrassed" by what has gone on (police cars at parents house to IVC him- friends saw this), letting his "emotions" get the best of him and feeling out of control. CSW listened and attempted to validate his feelings and thoughts while encouraging him to focus on getting the care he needs (now).  He appeared less anxious and reported "talking does make it better" as we ended our visit. He is open to seeing a therapist as an outpatient and reports plans were in place for him to see someone today before this crisis and hospitalization occurred.   Liliana Cline. 02/04/2015

## 2015-02-04 NOTE — Progress Notes (Signed)
D. Pt had been up and visible in milieu this evening, did attend and participate in evening group activity. Pt spoke some about what led to his hospitalization. Pt asked various questions as to the various processes in the hospital and was provided with the answers. Pt has not appeared to be in any acute distress this evening and has not shown any symptoms of withdrawal. A. Support and encouragement provided. R. Safety maintained, will continue to monitor.

## 2015-02-04 NOTE — BHH Group Notes (Signed)
[facility] LCSW Aftercare Discharge Planning Group Note  02/04/2015  8:45 AM  Participation Quality: Did Not Attend. Patient invited to participate but declined.  Gerrard Crystal, MSW, LCSWA Clinical Social Worker [facility] [phone]   

## 2015-02-04 NOTE — [facility] Group Notes (Signed)
[facility] LCSW Group Therapy 02/04/2015  1:15 PM Type of Therapy: Group Therapy Participation Level: Active  Participation Quality: Attentive  Affect: Appropriate  Cognitive: Alert and Oriented  Insight: Developing/Improving and Engaged  Engagement in Therapy: Developing/Improving and Engaged  Modes of Intervention: Clarification, Confrontation, Discussion, Education, Exploration, Limit-setting, Orientation, Problem-solving, Rapport Building, Dance movement psychotherapisteality Testing, Socialization and Support  Summary of Progress/Problems: The topic for group today was emotional regulation. This group focused on both positive and negative emotion identification and allowed group members to process ways to identify feelings, regulate negative emotions, and find healthy ways to manage internal/external emotions. Group members were asked to reflect on a time when their reaction to an emotion led to a negative outcome and explored how alternative responses using emotion regulation would have benefited them. Group members were also asked to discuss a time when emotion regulation was utilized when a negative emotion was experienced. Patient participated minimally in discussion despite CSW encouragement but was observed attentively listening.  Luis George, MSW, [employer]CSWA Clinical Social Worker [facility] [phone]

## 2015-02-04 NOTE — Progress Notes (Signed)
Met with patient 1:1. He presents with anxious affect with congruent mood. Rates depression at a 1/10, denies hopelessness and anxiety at a 2/10. States he is only a "social drinker" and doesn't feel he needs to be here at [facility]. Denies ever having symptoms of mental illness. Reports his goal is to "just be happy and stay busy." Plans to meet this goal by "talking to people." Denies pain or physical problems. Denies withdrawal symptoms. Patient given support. Encouraged to participate in programming. Patient denies SI/HI and remains safe. ,  Eakes  

## 2015-02-04 NOTE — Progress Notes (Signed)
Pt attended AA group this evening.  

## 2015-02-04 NOTE — H&P (Signed)
Psychiatric Admission Assessment Adult  Patient Identification: Luis George MRN:  [id number] Date of Evaluation:  02/04/2015 Chief Complaint:  Unspecified Depressive Disorder Unspecified Anxiety Disorder Alcohol Use Disorder Principal Diagnosis: <principal problem not specified> Diagnosis:   Patient Active Problem List   Diagnosis Date Noted  . Alcohol use disorder, severe, dependence [F10.20] 02/03/2015  . Polysubstance abuse [F19.10] 02/03/2015   History of Present Illness:: 19 Y/O male who states he had a good friendship end, a recent brake up with his GF. States he got really upset. States the friend thought he was trying to get together with one of his ex GF. States this was very upsetting. States that he has conflictive interactions with his father. States he drinks few times a week. He was drinking dealing with the loss of the relationship with his GF when he got the news of his best friend terminating their relationship. He admits he was upset. States he drinks couple of beers and he is fine. States he was feeling down and thought that using some cocaine would help. Claims he does not use it on a regular basis. At school he is busy with school work does not drink that much. States that he did cocaine two days before he came here. He also minimizes his use of Benzodiazepines states they were there. Denies feeling suicidal. Admits that whatever he might have said was under the influence.  Elements:  Location:  depression, substance abuse. Quality:  lost the relationship with a good friend, has terminated wiht his GF, got to drink did cocaine trying to feel better. Severity:  moderated. Timing:  every day. Duration:  buiding up during this last week. Context:  dealing with lossess got to drink used cocaine benzos acted out . Associated Signs/Symptoms: Depression Symptoms:  depressed mood, anxiety, (Hypo) Manic Symptoms:  Irritable Mood, Anxiety Symptoms:  Excessive Worry, Psychotic  Symptoms:  denies PTSD Symptoms: Negative Total Time spent with patient: 45 minutes  Past Medical History: No past medical history on file.  Past Surgical History  Procedure Laterality Date  . Orif wrist fracture     Family History: No family history on file.  Mother on depression medications.  Social History:  History  Alcohol Use  . Yes     History  Drug Use  . Yes  . Special: Cocaine    Comment: xanax    History   Social History  . Marital Status: Single    Spouse Name: N/A  . Number of Children: N/A  . Years of Education: N/A   Social History Main Topics  . Smoking status: Never Smoker   . Smokeless tobacco: Never Used  . Alcohol Use: Yes  . Drug Use: Yes    Special: Cocaine     Comment: xanax  . Sexual Activity: Yes   Other Topics Concern  . 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 Was on [employer] this last semester. He is home for the summer. 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 Born in [place] came here in 6 th grade as father moved for [place]. 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 Did well. Has a brother and a sister Additional Social History:    Pain Medications: na Prescriptions: na Over the Counter: na History of alcohol / drug use?: Yes Longest period of sobriety (when/how long): "a long time" socially drinks only Negative Consequences of Use: Legal Name of Substance 1: alcohol 1 - Age of First  Use: 15 1 - Amount (size/oz): 3 beers or more 1 - Frequency: socially, 2-3 time a month 1 - Duration: since 15 1 - Last Use / Amount: yesterday Name of Substance 2: marijuana 2 - Age of First Use: high school 2 - Amount (size/oz): unknown 2 - Frequency: usually socially, occassionally 2 - Duration: since high school 2 - Last Use / Amount: yesterday Name of Substance 3: cocaine 3 - Age of First Use: 18 3 - Amount (size/oz): unknown to pt 3 - Frequency: first time per pt 3 - Duration: yesterday  ony 3 - Last Use / Amount: unknown Name of Substance 4: xanax 4 - Age of First Use: unknown 4 - Amount (size/oz): unknown 4 - Frequency: unknown 4 - Duration: unknown 4 - Last Use / Amount: yesterday             Musculoskeletal: Strength & Muscle Tone: within normal limits Gait & Station: normal Patient leans: normal  Psychiatric Specialty Exam: Physical Exam  Review of Systems  Constitutional: Negative.   HENT: Negative.   Eyes: Negative.   Respiratory: Negative.   Cardiovascular: Negative.   Gastrointestinal: Negative.   Genitourinary: Negative.   Musculoskeletal: Negative.   Skin: Positive for rash.  Neurological: Negative.   Endo/Heme/Allergies: Negative.   Psychiatric/Behavioral: Positive for depression and substance abuse. The patient is nervous/anxious.     Blood pressure 118/89, pulse 85, temperature 97.5 F (36.4 C), resp. rate 16.There is no height or weight on file to calculate BMI.  General Appearance: Fairly Groomed  Engineer, water::  Fair  Speech:  Clear and Coherent  Volume:  Normal  Mood:  Anxious and worried  Affect:  sad, anxious worried frustrated  Thought Process:  Coherent and Goal Directed  Orientation:  Full (Time, Place, and Person)  Thought Content:  symptoms events worries concerns  Suicidal Thoughts:  No  Homicidal Thoughts:  No  Memory:  Immediate;   Fair Recent;   Fair Remote;   Fair  Judgement:  Fair  Insight:  Present  Psychomotor Activity:  Restlessness  Concentration:  Fair  Recall:  [employer] of Knowledge:Fair  Language: Fair  Akathisia:  No  Handed:  Right  AIMS (if indicated):     Assets:  Desire for Improvement Housing Social Support Talents/Skills Vocational/Educational  ADL's:  Intact  Cognition: WNL  Sleep:  Number of Hours: 6.25   Risk to Self: Is patient at risk for suicide?: Yes Risk to Others:   Prior Inpatient Therapy:  Denies Prior Outpatient Therapy:  Denies  Alcohol Screening: 1. How often do you  have a drink containing alcohol?: 2 to 4 times a month 2. How many drinks containing alcohol do you have on a typical day when you are drinking?: 3 or 4 3. How often do you have six or more drinks on one occasion?: Monthly Preliminary Score: 3 4. How often during the last year have you found that you were not able to stop drinking once you had started?: Less than monthly 5. How often during the last year have you failed to do what was normally expected from you becasue of drinking?: Less than monthly 6. How often during the last year have you needed a first drink in the morning to get yourself going after a heavy drinking session?: Never 7. How often during the last year have you had a feeling of guilt of remorse after drinking?: Never 8. How often during the last year have you been unable to remember  what happened the night before because you had been drinking?: Never 9. Have you or someone else been injured as a result of your drinking?: No 10. Has a relative or friend or a doctor or another health worker been concerned about your drinking or suggested you cut down?: No Alcohol Use Disorder Identification Test Final Score (AUDIT): 7 Brief Intervention: Yes  Allergies:  No Known Allergies Lab Results:  Results for orders placed or performed during the hospital encounter of 02/02/15 (from the past 48 hour(s))  Urine rapid drug screen (hosp performed)     Status: Abnormal   Collection Time: 02/02/15  8:25 PM  Result Value Ref Range   Opiates NONE DETECTED NONE DETECTED   Cocaine POSITIVE (A) NONE DETECTED   Benzodiazepines POSITIVE (A) NONE DETECTED   Amphetamines NONE DETECTED NONE DETECTED   Tetrahydrocannabinol POSITIVE (A) NONE DETECTED   Barbiturates NONE DETECTED NONE DETECTED    Comment:        DRUG SCREEN FOR MEDICAL PURPOSES ONLY.  IF CONFIRMATION IS NEEDED FOR ANY PURPOSE, NOTIFY LAB WITHIN 5 DAYS.        LOWEST DETECTABLE LIMITS FOR URINE DRUG SCREEN Drug Class        Cutoff (ng/mL) Amphetamine      1000 Barbiturate      200 Benzodiazepine   976 Tricyclics       734 Opiates          300 Cocaine          300 THC              50   Comprehensive metabolic panel     Status: Abnormal   Collection Time: 02/02/15  8:36 PM  Result Value Ref Range   Sodium 143 135 - 145 mmol/L   Potassium 3.7 3.5 - 5.1 mmol/L   Chloride 110 101 - 111 mmol/L   CO2 22 22 - 32 mmol/L   Glucose, Bld 81 65 - 99 mg/dL   BUN 11 6 - 20 mg/dL   Creatinine, Ser 1.21 0.61 - 1.24 mg/dL   Calcium 9.0 8.9 - 10.3 mg/dL   Total Protein 7.8 6.5 - 8.1 g/dL   Albumin 4.3 3.5 - 5.0 g/dL   AST 21 15 - 41 U/L   ALT 16 (L) 17 - 63 U/L   Alkaline Phosphatase 112 38 - 126 U/L   Total Bilirubin 1.0 0.3 - 1.2 mg/dL   GFR calc non Af Amer >60 >60 mL/min   GFR calc Af Amer >60 >60 mL/min    Comment: (NOTE) The eGFR has been calculated using the CKD EPI equation. This calculation has not been validated in all clinical situations. eGFR's persistently <60 mL/min signify possible Chronic Kidney Disease.    Anion gap 11 5 - 15  Ethanol (ETOH)     Status: Abnormal   Collection Time: 02/02/15  8:36 PM  Result Value Ref Range   Alcohol, Ethyl (B) 128 (H) <5 mg/dL    Comment:        LOWEST DETECTABLE LIMIT FOR SERUM ALCOHOL IS 5 mg/dL FOR MEDICAL PURPOSES ONLY   Salicylate level     Status: None   Collection Time: 02/02/15  8:36 PM  Result Value Ref Range   Salicylate Lvl <1.9 2.8 - 30.0 mg/dL  Acetaminophen level     Status: Abnormal   Collection Time: 02/02/15  8:36 PM  Result Value Ref Range   Acetaminophen (Tylenol), Serum <10 (L) 10 - 30 ug/mL  Comment:        THERAPEUTIC CONCENTRATIONS VARY SIGNIFICANTLY. A RANGE OF 10-30 ug/mL MAY BE AN EFFECTIVE CONCENTRATION FOR MANY PATIENTS. HOWEVER, SOME ARE BEST TREATED AT CONCENTRATIONS OUTSIDE THIS RANGE. ACETAMINOPHEN CONCENTRATIONS >150 ug/mL AT 4 HOURS AFTER INGESTION AND >50 ug/mL AT 12 HOURS AFTER INGESTION ARE OFTEN  ASSOCIATED WITH TOXIC REACTIONS.   CBC     Status: None   Collection Time: 02/02/15  8:36 PM  Result Value Ref Range   WBC 8.8 4.0 - 10.5 K/uL   RBC 4.68 4.22 - 5.81 MIL/uL   Hemoglobin 15.1 13.0 - 17.0 g/dL   HCT 42.4 39.0 - 52.0 %   MCV 90.6 78.0 - 100.0 fL   MCH 32.3 26.0 - 34.0 pg   MCHC 35.6 30.0 - 36.0 g/dL   RDW 12.1 11.5 - 15.5 %   Platelets 267 150 - 400 K/uL   Current Medications: Current Facility-Administered Medications  Medication Dose Route Frequency Provider Last Rate Last Dose  . acetaminophen (TYLENOL) tablet 650 mg  650 mg Oral Q6H PRN Niel Hummer, NP      . alum & mag hydroxide-simeth (MAALOX/MYLANTA) 200-200-20 MG/5ML suspension 30 mL  30 mL Oral Q4H PRN Niel Hummer, NP      . chlordiazePOXIDE (LIBRIUM) capsule 25 mg  25 mg Oral Q6H PRN Niel Hummer, NP      . hydrOXYzine (ATARAX/VISTARIL) tablet 25 mg  25 mg Oral Q6H PRN Niel Hummer, NP      . magnesium hydroxide (MILK OF MAGNESIA) suspension 30 mL  30 mL Oral Daily PRN Niel Hummer, NP      . traZODone (DESYREL) tablet 50 mg  50 mg Oral QHS PRN Niel Hummer, NP       PTA Medications: No prescriptions prior to admission    Previous Psychotropic Medications: No   Substance Abuse History in the last 12 months:  Yes.      Consequences of Substance Abuse: Legal Consequences:  DWI  Results for orders placed or performed during the hospital encounter of 02/02/15 (from the past 72 hour(s))  Urine rapid drug screen (hosp performed)     Status: Abnormal   Collection Time: 02/02/15  8:25 PM  Result Value Ref Range   Opiates NONE DETECTED NONE DETECTED   Cocaine POSITIVE (A) NONE DETECTED   Benzodiazepines POSITIVE (A) NONE DETECTED   Amphetamines NONE DETECTED NONE DETECTED   Tetrahydrocannabinol POSITIVE (A) NONE DETECTED   Barbiturates NONE DETECTED NONE DETECTED    Comment:        DRUG SCREEN FOR MEDICAL PURPOSES ONLY.  IF CONFIRMATION IS NEEDED FOR ANY PURPOSE, NOTIFY LAB WITHIN 5 DAYS.         LOWEST DETECTABLE LIMITS FOR URINE DRUG SCREEN Drug Class       Cutoff (ng/mL) Amphetamine      1000 Barbiturate      200 Benzodiazepine   096 Tricyclics       045 Opiates          300 Cocaine          300 THC              50   Comprehensive metabolic panel     Status: Abnormal   Collection Time: 02/02/15  8:36 PM  Result Value Ref Range   Sodium 143 135 - 145 mmol/L   Potassium 3.7 3.5 - 5.1 mmol/L   Chloride 110 101 - 111 mmol/L   CO2  22 22 - 32 mmol/L   Glucose, Bld 81 65 - 99 mg/dL   BUN 11 6 - 20 mg/dL   Creatinine, Ser 1.21 0.61 - 1.24 mg/dL   Calcium 9.0 8.9 - 10.3 mg/dL   Total Protein 7.8 6.5 - 8.1 g/dL   Albumin 4.3 3.5 - 5.0 g/dL   AST 21 15 - 41 U/L   ALT 16 (L) 17 - 63 U/L   Alkaline Phosphatase 112 38 - 126 U/L   Total Bilirubin 1.0 0.3 - 1.2 mg/dL   GFR calc non Af Amer >60 >60 mL/min   GFR calc Af Amer >60 >60 mL/min    Comment: (NOTE) The eGFR has been calculated using the CKD EPI equation. This calculation has not been validated in all clinical situations. eGFR's persistently <60 mL/min signify possible Chronic Kidney Disease.    Anion gap 11 5 - 15  Ethanol (ETOH)     Status: Abnormal   Collection Time: 02/02/15  8:36 PM  Result Value Ref Range   Alcohol, Ethyl (B) 128 (H) <5 mg/dL    Comment:        LOWEST DETECTABLE LIMIT FOR SERUM ALCOHOL IS 5 mg/dL FOR MEDICAL PURPOSES ONLY   Salicylate level     Status: None   Collection Time: 02/02/15  8:36 PM  Result Value Ref Range   Salicylate Lvl <3.5 2.8 - 30.0 mg/dL  Acetaminophen level     Status: Abnormal   Collection Time: 02/02/15  8:36 PM  Result Value Ref Range   Acetaminophen (Tylenol), Serum <10 (L) 10 - 30 ug/mL    Comment:        THERAPEUTIC CONCENTRATIONS VARY SIGNIFICANTLY. A RANGE OF 10-30 ug/mL MAY BE AN EFFECTIVE CONCENTRATION FOR MANY PATIENTS. HOWEVER, SOME ARE BEST TREATED AT CONCENTRATIONS OUTSIDE THIS RANGE. ACETAMINOPHEN CONCENTRATIONS >150 ug/mL AT 4 HOURS  AFTER INGESTION AND >50 ug/mL AT 12 HOURS AFTER INGESTION ARE OFTEN ASSOCIATED WITH TOXIC REACTIONS.   CBC     Status: None   Collection Time: 02/02/15  8:36 PM  Result Value Ref Range   WBC 8.8 4.0 - 10.5 K/uL   RBC 4.68 4.22 - 5.81 MIL/uL   Hemoglobin 15.1 13.0 - 17.0 g/dL   HCT 42.4 39.0 - 52.0 %   MCV 90.6 78.0 - 100.0 fL   MCH 32.3 26.0 - 34.0 pg   MCHC 35.6 30.0 - 36.0 g/dL   RDW 12.1 11.5 - 15.5 %   Platelets 267 150 - 400 K/uL    Observation Level/Precautions:  15 minute checks  Laboratory:  As per the ED  Psychotherapy:  Individual/group  Medications:  Will assess for the need for psychotropics  Consultations:    Discharge Concerns:    Estimated LOS: 3-5 days  Other:     Psychological Evaluations: No   Treatment Plan Summary: Daily contact with patient to assess and evaluate symptoms and progress in treatment and Medication management Supportive approach/coping skills polysubstance abuse; determine detox needs and address Work a relapse prevention plan Depression; help process the losses he has been trough  Reassess for the need of an antidepressant. CBT/mindfulness Get collateral information Medical Decision Making:  Review of Psycho-Social Stressors (1), Review or order clinical lab tests (1) and Review of Medication Regimen & Side Effects (2)  I certify that inpatient services furnished can reasonably be expected to improve the patient's condition.   Natoya Viscomi A 7/13/20168:52 AM

## 2015-02-04 NOTE — Progress Notes (Signed)
Recreation Therapy Notes  Date: 07.13.16 Time: 9:30 am Location: [address] Dayroom  Group Topic: Stress Management  Goal Area(s) Addresses:  Patient will verbalize importance of using healthy stress management.  Patient will identify positive emotions associated with healthy stress management.   Behavioral Outcome:  Engaged  Intervention: Stress Management  Activity :  Guided Imagery.  LRT introduced and explained the stress management technique of guided imagery.  LRT used a script to deliver the technique.  Patients were asked to follow the scripts read a loud by the LRT to participate in the stress management technique.  Education:  Stress Management, Discharge Planning.   Education Outcome: Acknowledges edcuation/In group clarification offered/Needs additional education  Clinical Observations/Feedback: Patient attended group.   Caroll RancherMarjette Assad Harbeson, LRT/CTRS         Caroll RancherLindsay, Henlee Donovan A 02/04/2015 1:49 PM

## 2015-02-05 ENCOUNTER — Inpatient Hospital Stay (HOSPITAL_COMMUNITY): Payer: Federal, State, Local not specified - PPO

## 2015-02-05 ENCOUNTER — Encounter (HOSPITAL_COMMUNITY): Payer: Self-pay | Admitting: Emergency Medicine

## 2015-02-05 LAB — GLUCOSE, CAPILLARY: GLUCOSE-CAPILLARY: 113 mg/dL — AB (ref 65–99)

## 2015-02-05 MED ORDER — LORAZEPAM 2 MG/ML IJ SOLN
INTRAMUSCULAR | Status: AC
  Administered 2015-02-05: 17:00:00
  Filled 2015-02-05: qty 1

## 2015-02-05 NOTE — ED Provider Notes (Signed)
I saw and evaluated the patient, reviewed the resident's note and I agree with the findings and plan.   EKG Interpretation   Date/Time:  Thursday February 05 2015 18:14:47 EDT Ventricular Rate:  90 PR Interval:  138 QRS Duration: 95 QT Interval:  350 QTC Calculation: 428 R Axis:   59 Text Interpretation:  Sinus rhythm Consider right atrial enlargement  Borderline Q waves in lateral leads Borderline repolarization abnormality  Confirmed by Freida BusmanALLEN  MD, Tyrell Brereton ([id number]) on 02/05/2015 7:16:06 PM     Patient had a witnessed seizure prior to arrival at the [facility]. Post ictal period noted. Patient treated with Ativan prior to arrival. Patient without prior history of seizure disorder. Neurological exam here nonfocal. Will obtain head CT as well as blood work and likely sent back to behavior health.  Lorre NickAnthony Crysta Gulick, MD 02/05/15 [phone]

## 2015-02-05 NOTE — Progress Notes (Signed)
Patient observed angry, yelling with with mom on phone for several minutes. Very angry, upset as mom will not allow him to return to home. "She is telling me I am cut off. What am I going to do? My life is over! You don't understand, I won't ever be able to catch up. I finally am living with friends and have caught up my academics." Patient stating, "I don't have a substance use history. I'm not like these people. I used once other than marijuana that I only use occasionally. I don't need meds. I don't need rehab. This is crazy." Gently suggested to patient that given his legal charges as well as the events that led to his IVC, that he might benefit from additional treatment. Suggested mom is scared for him and his well being. Patient finally did agree to take vistaril prn and on reassess, patient resting in bed with blanket over head. RR WNL. Will continue to monitor closely. Lawrence MarseillesFriedman, Kanchan Gal Eakes

## 2015-02-05 NOTE — ED Notes (Addendum)
Per EMS, [facility] with suicidal ideations. Pt was talking to a counselor when he fell against the wall striking the front of his head and then had a full body seizure lasting 1 minute. Pt received 2mg  IM of ativan. Pt alert x4. NAD at this time. Pt had Vistaril for the first time today.

## 2015-02-05 NOTE — Progress Notes (Signed)
Patient up and visible in the dayroom. Participating in programming, interacting appropriately. Affect anxious with congruent mood however rates anxiety at a 2/10. Depression and hopelessness is a 0/10. Patient continues to display limited insight. States he is waiting on SW and mother to tell him the plan after discharge. Does not feel he needs to be here nor does he need additional treatment. Patient supported and encouraged. Denies pain, physical complaints. Denies SI/HI and remains safe. Lawrence MarseillesFriedman, Hedda Crumbley Eakes

## 2015-02-05 NOTE — [facility] Group Notes (Signed)
[facility] Group Notes:  (Nursing/MHT/Case Management/Adjunct)  Date:  02/05/2015  Time:  0900  Type of Therapy:  Nurse Education - Lifestyle and Leisure  Participation Level:  Active  Participation Quality:  Attentive  Affect:  Anxious  Cognitive:  Alert  Insight:  Lacking  Engagement in Group:  Engaged  Modes of Intervention:  Discussion, Education and Support  Summary of Progress/Problems: Patient participated in group regarding healthy lifestyle changes.   Merian CapronFriedman, Alessander Sikorski [facility] Valley[facility]akes 02/05/2015, 0930

## 2015-02-05 NOTE — ED Provider Notes (Signed)
CSN: [id number]     Arrival date & time 02/05/15  1742 History   First MD Initiated Contact with Patient 02/05/15 1743     Chief Complaint  Patient presents with  . Seizures     (Consider location/radiation/quality/duration/timing/severity/associated sxs/prior Treatment) HPI Patient is a 19 year old male with no known seizure history presented today for seizure while at physician's office. Is currently I received in behavioral health for suicidal ideations. He was talking with his doctor today when he stood up film at the doorway and started having seizure. Given Ativan 2 mg IM after seizure resolved spontaneously. She denies any chest pain shortness of breath headache prior to event. Denies taking any drugs, drinking alcohol, and only admits to new medication today. Patient denies any previous prolonged call use and denies drinking every day. Also denies any abuse of benzodiazepines chronically. Reports no history of withdrawal in the past.  History reviewed. No pertinent past medical history. Past Surgical History  Procedure Laterality Date  . Orif wrist fracture     History reviewed. No pertinent family history. History  Substance Use Topics  . Smoking status: Never Smoker   . Smokeless tobacco: Never Used  . Alcohol Use: Yes    Review of Systems  Constitutional: Negative for fever and chills.  HENT: Negative for congestion and sore throat.   Eyes: Negative for pain.  Respiratory: Negative for cough and shortness of breath.   Cardiovascular: Negative for chest pain and palpitations.  Gastrointestinal: Negative for nausea, vomiting, abdominal pain and diarrhea.  Endocrine: Negative.   Genitourinary: Negative for flank pain.  Musculoskeletal: Negative for back pain and neck pain.  Skin: Negative for rash.  Allergic/Immunologic: Negative.   Neurological: Positive for seizures. Negative for dizziness, syncope and light-headedness.  Psychiatric/Behavioral: Negative for confusion.       Allergies  Review of patient's allergies indicates no known allergies.  Home Medications   Prior to Admission medications   Not on File   BP 110/59 mmHg  Pulse 96  Temp(Src) 98.2 F (36.8 C) (Oral)  Resp 16  SpO2 99% Physical Exam  Constitutional: He is oriented to person, place, and time. He appears well-developed and well-nourished.  HENT:  Head: Normocephalic and atraumatic.  Eyes: Conjunctivae and EOM are normal. Pupils are equal, round, and reactive to light.  Neck: Normal range of motion. Neck supple.  Cardiovascular: Normal rate, regular rhythm, normal heart sounds and intact distal pulses.   Pulmonary/Chest: Effort normal and breath sounds normal. No respiratory distress.  Abdominal: Soft. Bowel sounds are normal. There is no tenderness.  Musculoskeletal: Normal range of motion.  Neurological: He is alert and oriented to person, place, and time. He has normal strength and normal reflexes. He displays normal reflexes. No cranial nerve deficit or sensory deficit. He displays a negative Romberg sign.  Normal finger to nose bilaterally.  Rapid alternating movements intact bilaterally.  Normal heal to shin bilaterally.   No pronator drift bilaterally.    Skin: Skin is warm and dry.    ED Course  Procedures (including critical care time) Labs Review Labs Reviewed  GLUCOSE, CAPILLARY - Abnormal; Notable for the following:    Glucose-Capillary 113 (*)    All other components within normal limits  URINE CULTURE  ETHANOL  URINE RAPID DRUG SCREEN, HOSP PERFORMED  CBC WITH DIFFERENTIAL/PLATELET  BASIC METABOLIC PANEL  CK  BASIC METABOLIC PANEL  CBC  CK  ETHANOL  URINALYSIS, ROUTINE W REFLEX MICROSCOPIC (NOT AT [facility])  STREP PNEUMONIAE URINARY ANTIGEN  LEGIONELLA ANTIGEN, URINE  URINE MICROSCOPIC-ADD ON    Imaging Review Ct Head Wo Contrast  02/05/2015   CLINICAL DATA:  New onset seizures  EXAM: CT HEAD WITHOUT CONTRAST  TECHNIQUE: Contiguous axial images  were obtained from the base of the skull through the vertex without intravenous contrast.  COMPARISON:  None.  FINDINGS: The ventricles are normal in size and configuration. There is no intracranial mass, hemorrhage, extra-axial fluid collection, or midline shift. Gray-white compartments normal. There is no demonstrable acute infarct. The bony calvarium appears intact. The mastoid air cells are clear.  IMPRESSION: Study within normal limits.   Electronically Signed   By: Bretta Bang III M.D.   On: 02/05/2015 19:16     EKG Interpretation   Date/Time:  Thursday February 05 2015 18:14:47 EDT Ventricular Rate:  90 PR Interval:  138 QRS Duration: 95 QT Interval:  350 QTC Calculation: 428 R Axis:   59 Text Interpretation:  Sinus rhythm Consider right atrial enlargement  Borderline Q waves in lateral leads Borderline repolarization abnormality  Confirmed by Freida Busman  MD, ANTHONY ([id number]) on 02/05/2015 7:16:06 PM      MDM   Final diagnoses:  Syncope, unspecified syncope type   On initial evaluation patient hemodynamically stable and in no acute distress. Alert and oriented 3. Patient reports this is new onset seizure and only took Vistaril today as new medication. Denies taking any other medications at facility.  Possible that it lowered his seizure threshold.  Corticated glucose within normal limits. CT head performed showing no acute intracranial abnormalities. CBC and BMP with no abnormalities to indicate syncope or that would induce seizure activity. CK was within normal limits. Patient denies drinking alcohol or taking benzodiazepines chronically. Doubt withdrawal seizures at this time. Patient deemed safe to return to the behavioral health facility and to not take Vistaril.  If performed, labs, EKGs, and imaging were reviewed/interpreted by myself and my attending and incorporated into medical decision making.  Discussed pertinent finding with patient or caregiver prior to discharge with no  further questions.  Immediate return precautions given and pt or caregiver reports understanding.  Pt care supervised by my attending Dr. Wyonia Hough, MD PGY-2  Emergency Medicine      Tery Sanfilippo, MD 02/06/15 [phone]

## 2015-02-05 NOTE — Progress Notes (Signed)
[facility]  02/05/2015 9:08 PM Luis George  MRN:  [id number] Subjective:  Luis George had some conflictive interactions with his family. They want him to go to rehab. He states he does not have a substance abuse problem he has anxiety. He states that if he was to go to rehab for 30 days he might not be able to be back in [place] for the next semester. He stated that he worked very hard last semester got in the [employer] and feels he is on track. He stated his mother takes a serotonin agent. States he is still having a hard time with the brake up from his ex-GF. States he had a dream last night in which she was cheating on him Principal Problem: Substance induced mood disorder Diagnosis:   Patient Active Problem List   Diagnosis Date Noted  . Substance induced mood disorder [F19.94] 02/04/2015  . Adjustment disorder with mixed anxiety and depressed mood [F43.23] 02/04/2015  . Polysubstance abuse [F19.10] 02/03/2015   Total Time spent with patient: 30 minutes   Past Medical History: History reviewed. No pertinent past medical history.  Past Surgical History  Procedure Laterality Date  . Orif wrist fracture     Family History: History reviewed. No pertinent family history. Social History:  History  Alcohol Use  . Yes     History  Drug Use  . Yes  . Special: Cocaine    Comment: xanax    History   Social History  . Marital Status: Single    Spouse Name: N/A  . Number of Children: N/A  . Years of Education: N/A   Social History Main Topics  . Smoking status: Never Smoker   . Smokeless tobacco: Never Used  . Alcohol Use: Yes  . Drug Use: Yes    Special: Cocaine     Comment: xanax  . Sexual Activity: Yes   Other Topics Concern  . None   Social History Narrative   Additional History:    Sleep: Fair  Appetite:  Fair   Assessment:   Musculoskeletal: Strength & Muscle Tone: within normal limits Gait & Station: normal Patient leans: normal   Psychiatric  Specialty Exam: Physical Exam  Review of Systems  Constitutional: Negative.   HENT: Negative.   Eyes: Negative.   Respiratory: Negative.   Cardiovascular: Negative.   Gastrointestinal: Negative.   Genitourinary: Negative.   Musculoskeletal: Negative.   Skin: Negative.   Neurological: Negative.   Endo/Heme/Allergies: Negative.   Psychiatric/Behavioral: Positive for substance abuse. The patient is nervous/anxious.     Blood pressure 125/77, pulse 91, temperature 98.2 F (36.8 C), temperature source Oral, resp. rate 18, SpO2 100 %.There is no height or weight on file to calculate BMI.  General Appearance: Fairly Groomed  Patent attorneyye Contact::  Fair  Speech:  Clear and Coherent  Volume:  Normal  Mood:  Anxious and upset about his parents wanting him to go to rehab  Affect:  Appropriate  Thought Process:  Coherent and Goal Directed  Orientation:  Full (Time, Place, and Person)  Thought Content:  worries concerns  Suicidal Thoughts:  No  Homicidal Thoughts:  No  Memory:  Immediate;   Fair Recent;   Fair Remote;   Fair  Judgement:  Fair  Insight:  Shallow  Psychomotor Activity:  Normal  Concentration:  Fair  Recall:  [employer]Fair  Fund of Knowledge:Fair  Language: Fair  Akathisia:  No  Handed:  Right  AIMS (if indicated):     Assets:  Desire for Improvement Housing Social Support Vocational/Educational  ADL's:  Intact  Cognition: WNL  Sleep:  Number of Hours: 6.25     Current Medications: Current Facility-Administered Medications  Medication Dose Route Frequency Provider Last Rate Last Dose  . acetaminophen (TYLENOL) tablet 650 mg  650 mg Oral Q6H PRN Thermon Leyland, NP      . alum & mag hydroxide-simeth (MAALOX/MYLANTA) 200-200-20 MG/5ML suspension 30 mL  30 mL Oral Q4H PRN Thermon Leyland, NP      . chlordiazePOXIDE (LIBRIUM) capsule 25 mg  25 mg Oral Q6H PRN Thermon Leyland, NP      . hydrOXYzine (ATARAX/VISTARIL) tablet 25 mg  25 mg Oral Q6H PRN Thermon Leyland, NP   25 mg at 02/05/15  1451  . magnesium hydroxide (MILK OF MAGNESIA) suspension 30 mL  30 mL Oral Daily PRN Thermon Leyland, NP      . traZODone (DESYREL) tablet 50 mg  50 mg Oral QHS PRN Thermon Leyland, NP       No current outpatient prescriptions on file.    Lab Results:  Results for orders placed or performed during the hospital encounter of 02/03/15 (from the past 48 hour(s))  Glucose, capillary     Status: Abnormal   Collection Time: 02/05/15  4:57 PM  Result Value Ref Range   Glucose-Capillary 113 (H) 65 - 99 mg/dL    Physical Findings: AIMS: Facial and Oral Movements Muscles of Facial Expression: None, normal Lips and Perioral Area: None, normal Jaw: None, normal Tongue: None, normal,Extremity Movements Upper (arms, wrists, hands, fingers): None, normal Lower (legs, knees, ankles, toes): None, normal, Trunk Movements Neck, shoulders, hips: None, normal, Overall Severity Severity of abnormal movements (highest score from questions above): None, normal Incapacitation due to abnormal movements: None, normal Patient's awareness of abnormal movements (rate only patient's report): No Awareness, Dental Status Current problems with teeth and/or dentures?: No Does patient usually wear dentures?: No  CIWA:  CIWA-Ar Total: 0 COWS:  COWS Total Score: 0  Treatment Plan Summary: Daily contact with patient to assess and evaluate symptoms and progress in treatment and Medication management  Anxiety; discuss treatment options including psychotherapy. We discussed that benzodiazepines were not a good idea due to the abuse potential the dependency from benzodiazepines and he agreed. We talked about an SSRI and Buspar. He was going to find out what SSRI his mother was taking due to the possible familiar response. Later in the afternoon called [name] to my office to further discuss medication options and help process the interactions with his family. He repeated again that he does not need to go to a residential treatment  program as they want, that that is going to mess up his school. In the middle of the conversation, he staid quiet looked to his left side, stood up fell, started having a seizure. Staff were called for assistance, Ativan 2 mg IM were given, 911 was called and he was taken to the ED Medical Decision Making:  Review of Psycho-Social Stressors (1) and Review of Medication Regimen & Side Effects (2)     Makeisha Jentsch A 02/05/2015, 9:08 PM

## 2015-02-05 NOTE — Discharge Instructions (Signed)
Syncope °Syncope is a medical term for fainting or passing out. This means you lose consciousness and drop to the ground. People are generally unconscious for less than 5 minutes. You may have some muscle twitches for up to 15 seconds before waking up and returning to normal. Syncope occurs more often in older adults, but it can happen to anyone. While most causes of syncope are not dangerous, syncope can be a sign of a serious medical problem. It is important to seek medical care.  °CAUSES  °Syncope is caused by a sudden drop in blood flow to the brain. The specific cause is often not determined. Factors that can bring on syncope include: °· Taking medicines that lower blood pressure. °· Sudden changes in posture, such as standing up quickly. °· Taking more medicine than prescribed. °· Standing in one place for too long. °· Seizure disorders. °· Dehydration and excessive exposure to heat. °· Low blood sugar (hypoglycemia). °· Straining to have a bowel movement. °· Heart disease, irregular heartbeat, or other circulatory problems. °· Fear, emotional distress, seeing blood, or severe pain. °SYMPTOMS  °Right before fainting, you may: °· Feel dizzy or light-headed. °· Feel nauseous. °· See all white or all black in your field of vision. °· Have cold, clammy skin. °DIAGNOSIS  °Your health care provider will ask about your symptoms, perform a physical exam, and perform an electrocardiogram (ECG) to record the electrical activity of your heart. Your health care provider may also perform other heart or blood tests to determine the cause of your syncope which may include: °· Transthoracic echocardiogram (TTE). During echocardiography, sound waves are used to evaluate how blood flows through your heart. °· Transesophageal echocardiogram (TEE). °· Cardiac monitoring. This allows your health care provider to monitor your heart rate and rhythm in real time. °· Holter monitor. This is a portable device that records your  heartbeat and can help diagnose heart arrhythmias. It allows your health care provider to track your heart activity for several days, if needed. °· Stress tests by exercise or by giving medicine that makes the heart beat faster. °TREATMENT  °In most cases, no treatment is needed. Depending on the cause of your syncope, your health care provider may recommend changing or stopping some of your medicines. °HOME CARE INSTRUCTIONS °· Have someone stay with you until you feel stable. °· Do not drive, use machinery, or play sports until your health care provider says it is okay. °· Keep all follow-up appointments as directed by your health care provider. °· Lie down right away if you start feeling like you might faint. Breathe deeply and steadily. Wait until all the symptoms have passed. °· Drink enough fluids to keep your urine clear or pale yellow. °· If you are taking blood pressure or heart medicine, get up slowly and take several minutes to sit and then stand. This can reduce dizziness. °SEEK IMMEDIATE MEDICAL CARE IF:  °· You have a severe headache. °· You have unusual pain in the chest, abdomen, or back. °· You are bleeding from your mouth or rectum, or you have black or tarry stool. °· You have an irregular or very fast heartbeat. °· You have pain with breathing. °· You have repeated fainting or seizure-like jerking during an episode. °· You faint when sitting or lying down. °· You have confusion. °· You have trouble walking. °· You have severe weakness. °· You have vision problems. °If you fainted, call your local emergency services (911 in [place] Do not drive   yourself to the hospital.  °MAKE SURE YOU: °· Understand these instructions. °· Will watch your condition. °· Will get help right away if you are not doing well or get worse. °Document Released: 07/11/2005 Document Revised: 07/16/2013 Document Reviewed: 09/09/2011 °ExitCare® Patient Information ©2015 [employer]. This information is not intended to replace  advice given to you by your health care provider. Make sure you discuss any questions you have with your health care provider. ° °

## 2015-02-05 NOTE — Plan of Care (Signed)
Problem: Ineffective individual coping Goal: STG: Patient will remain free from self harm Outcome: Progressing Patient has not engaged in self harm and denies SI  Problem: Diagnosis: Increased Risk For Suicide Attempt Goal: STG-Patient Will Attend All Groups On The Unit Outcome: Progressing Patient attending and participating in groups and programming.

## 2015-02-05 NOTE — Tx Team (Signed)
Initial Interdisciplinary Treatment Plan   PATIENT STRESSORS: Loss of significant relationship/ broke up with his GF Lack of a relationship with his father   PATIENT STRENGTHS: General fund of knowledge Motivation for treatment/growth Supportive family/friends   PROBLEM LIST: Problem List/Patient Goals Date to be addressed Date deferred Reason deferred Estimated date of resolution  " Bad anxiety" (per admit note) 7/12     Increased risk for SI      Substance abuse( using xanax without a prescription)                                    *information derived from admission assesment       DISCHARGE CRITERIA:  Improved stabilization in mood, thinking, and/or behavior Need for constant or close observation no longer present Reduction of life-threatening or endangering symptoms to within safe limits Verbal commitment to aftercare and medication compliance  PRELIMINARY DISCHARGE PLAN: Attend PHP/IOP  PATIENT/FAMIILY INVOLVEMENT: This treatment plan has been presented to and reviewed with the patient, Luis George.  The patient and family have been given the opportunity to ask questions and make suggestions.  Luis George A 02/05/2015, 5:00 AM

## 2015-02-05 NOTE — [facility] Group Notes (Addendum)
[facility] LCSW Group Therapy 02/05/2015  1:15 pm   Type of Therapy: Group Therapy Participation Level: Active  Participation Quality: Attentive  Affect: Appropriate  Cognitive: Alert and Oriented  Insight: Developing/Improving and Engaged  Engagement in Therapy: Developing/Improving and Engaged  Modes of Intervention: Clarification, Confrontation, Discussion, Education, Exploration, Limit-setting, Orientation, Problem-solving, Rapport Building, Dance movement psychotherapisteality Testing, Socialization and Support  Summary of Progress/Problems: The topic for group was balance in life. Today's group focused on defining balance in one's own words, identifying things that can knock one off balance, and exploring healthy ways to maintain balance in life. Group members were asked to provide an example of a time when they felt off balance, describe how they handled that situation,and process healthier ways to regain balance in the future. Group members were asked to share the most important tool for maintaining balance that they learned [facility] and how they plan to apply this method after discharge. Patient reports that his life is out of balance by his anxiety. He reports that he wants to focus on letting go of things he cannot control. CSW and other group members provided patient with emotional support and encouragement.  Samuella BruinKristin Ashleigh Arya, MSW, [employer]CSWA Clinical Social Worker [facility] [phone]

## 2015-02-05 NOTE — ED Notes (Signed)
[facility]

## 2015-02-05 NOTE — ED Notes (Signed)
Sitter present at the bedside 

## 2015-02-05 NOTE — Progress Notes (Signed)
D: Pt presents anxious in affect and mood. Pt is observed interacting with others in dayroom. Pt reports anxiety as his primary problem. Pt is currently negative for any SI/HI/AVH. Pt is compliant with his current POC. A: Continued support and availability as needed was extended to this pt. Staff continue to monitor pt with q8315min checks.  R: Pt receptive to treatment. Pt remains safe at this time.

## 2015-02-05 NOTE — Progress Notes (Addendum)
Patient meeting with MD after becoming very upset with father on phone. Father reiterating what mother said regarding d/c plan. Pt suffered seizure lasting 1 min long. Hit head on floor. No prior seizure hx.  Patient's BP is 155/70, pulse 136. CBG - 113. Refusing pulse ox. Ativan 2mg  IM given. Patient confused postdictal, speech slurred. Previously clear and oriented. Report given to EMS and [facility] charge. Patient's father notified. Patient transported via EMS. Lawrence MarseillesFriedman, Sharvil Hoey Eakes

## 2015-02-06 LAB — BASIC METABOLIC PANEL
Anion gap: 13 (ref 5–15)
BUN: 8 mg/dL (ref 6–20)
CHLORIDE: 106 mmol/L (ref 101–111)
CO2: 21 mmol/L — AB (ref 22–32)
CREATININE: 1.1 mg/dL (ref 0.61–1.24)
Calcium: 9.4 mg/dL (ref 8.9–10.3)
GFR calc non Af Amer: >60 mL/min (ref >60)
Glucose, Bld: 92 mg/dL (ref 65–99)
POTASSIUM: 3.2 mmol/L — AB (ref 3.5–5.1)
SODIUM: 140 mmol/L (ref 135–145)

## 2015-02-06 LAB — CBC
HEMATOCRIT: 41.6 % (ref 39.0–52.0)
HEMOGLOBIN: 15.1 g/dL (ref 13.0–17.0)
MCH: 32.9 pg (ref 26.0–34.0)
MCHC: 36.3 g/dL — ABNORMAL HIGH (ref 30.0–36.0)
MCV: 90.6 fL (ref 78.0–100.0)
Platelets: 219 10*3/uL (ref 150–400)
RBC: 4.59 MIL/uL (ref 4.22–5.81)
RDW: 12.5 % (ref 11.5–15.5)
WBC: 10.8 10*3/uL — AB (ref 4.0–10.5)

## 2015-02-06 LAB — URINE MICROSCOPIC-ADD ON

## 2015-02-06 LAB — LEGIONELLA ANTIGEN, URINE

## 2015-02-06 LAB — URINALYSIS, ROUTINE W REFLEX MICROSCOPIC
Bilirubin Urine: NEGATIVE
GLUCOSE, UA: NEGATIVE mg/dL
KETONES UR: NEGATIVE mg/dL
LEUKOCYTES UA: NEGATIVE
NITRITE: NEGATIVE
PROTEIN: 100 mg/dL — AB
Specific Gravity, Urine: 1.021 (ref 1.005–1.030)
UROBILINOGEN UA: 0.2 mg/dL (ref 0.0–1.0)
pH: 5 (ref 5.0–8.0)

## 2015-02-06 LAB — CK: Total CK: 150 U/L (ref 49–397)

## 2015-02-06 LAB — ETHANOL

## 2015-02-06 LAB — STREP PNEUMONIAE URINARY ANTIGEN: STREP PNEUMO URINARY ANTIGEN: NEGATIVE

## 2015-02-06 MED ORDER — MAGNESIUM HYDROXIDE 400 MG/5ML PO SUSP: 30.0000 mL | Freq: Every day | ORAL | Status: DC | PRN

## 2015-02-06 MED ORDER — ACETAMINOPHEN 325 MG PO TABS: 650.0000 mg | ORAL_TABLET | Freq: Four times a day (QID) | ORAL | Status: DC | PRN

## 2015-02-06 MED ORDER — SERTRALINE HCL 25 MG PO TABS
25.0000 mg | ORAL_TABLET | Freq: Every day | ORAL | Status: DC
  Administered 2015-02-07: 25 mg via ORAL
  Filled 2015-02-06 (×2): qty 1
  Filled 2015-02-06 (×3): qty 3
  Filled 2015-02-06: qty 1

## 2015-02-06 MED ORDER — HYDROXYZINE HCL 25 MG PO TABS: 25.0000 mg | ORAL_TABLET | Freq: Four times a day (QID) | ORAL | Status: DC | PRN

## 2015-02-06 MED ORDER — LORAZEPAM 2 MG/ML IJ SOLN: 1.0000 mg | Freq: Four times a day (QID) | INTRAMUSCULAR | Status: DC | PRN

## 2015-02-06 MED ORDER — CHLORDIAZEPOXIDE HCL 25 MG PO CAPS: 25.0000 mg | ORAL_CAPSULE | Freq: Four times a day (QID) | ORAL | Status: DC | PRN

## 2015-02-06 MED ORDER — TRAZODONE HCL 50 MG PO TABS
50.0000 mg | ORAL_TABLET | Freq: Every evening | ORAL | Status: DC | PRN
  Filled 2015-02-06: qty 3

## 2015-02-06 MED ORDER — SERTRALINE HCL 25 MG PO TABS
25.0000 mg | ORAL_TABLET | Freq: Every day | ORAL | Status: DC
  Filled 2015-02-06: qty 1

## 2015-02-06 MED ORDER — ALUM & MAG HYDROXIDE-SIMETH 200-200-20 MG/5ML PO SUSP: 30.0000 mL | ORAL | Status: DC | PRN

## 2015-02-06 NOTE — Progress Notes (Signed)
Pt attended AA group this evening.  

## 2015-02-06 NOTE — [facility] Group Notes (Signed)
   [facility] Note  02/06/2015  8:45 AM   Participation Quality: Alert, Appropriate and Oriented  Mood/Affect: Appropriate  Depression Rating: 0  Anxiety Rating: 1-2  Thoughts of Suicide: Pt denies SI/HI  Will you contract for safety? Yes  Current AVH: Pt denies  Plan for Discharge/Comments: Pt attended discharge planning group and actively participated in group. CSW provided pt with today's workbook. Patient plans to return home with family and follow up with outpatient services.  Transportation Means: Pt reports access to transportation  Supports: No supports mentioned at this time  Samuella BruinKristin Marelin Tat, MSW, [employer]CSWA Clinical Social Worker [facility] [phone]

## 2015-02-06 NOTE — Clinical Social Work Note (Signed)
CSW left message for Mother Eston Mouldatricia Waterhouse [phone].   Samuella BruinKristin Oz Gammel, MSW, [employer]CSWA Clinical Social Worker [facility] [phone]

## 2015-02-06 NOTE — Progress Notes (Signed)
D: Pt greeted upon returning from the ED. Writer discussed ED Physicians findings of his seizure being related to his use of Vistaril. Pt reports no hx of seizures. Pt denied any current withdrawal symptoms. Pt's lack of withdrawal symptoms is consistent with his previous [facility]. Pt continues to present anxious in affect and mood. Pt is currently negative for any SI/HI/AVH. Pt verbalizes no concerns he wishes for this writer to address at this time.  A: Continued support and availability as needed was extended to this pt. Staff continue to monitor pt with q2815min checks.  R: Pt receptive to treatment. Pt remains safe at this time.

## 2015-02-06 NOTE — Progress Notes (Signed)
Recreation Therapy Notes  Date: 07.15.16 Time: 9:30 am Location: [address] Dayroom  Group Topic: Stress Management  Goal Area(s) Addresses:  Patient will verbalize importance of using healthy stress management.  Patient will identify positive emotions associated with healthy stress management.   Behavioral Outcome: Engaged  Intervention: Stress Management  Activity : Progressive Muscle Relaxation. LRT introduced and explained the stress management technique of progressive muscle relaxation. LRT used George script to deliver the technique. Patients were asked to follow the script read George loud by the LRT to participate in the stress management technique.  Education: Stress Management, Discharge Planning.   Education Outcome: Acknowledges edcuation/In group clarification offered  Clinical Observations/Feedback: Patient attended group.   Luis RancherMarjette Sharronda George, LRT/CTRS   Lillia AbedLindsay, Luis George 02/06/2015 11:40 AM

## 2015-02-06 NOTE — [facility] Suicide Risk Assessment (Signed)
[facility] INPATIENT:  Family/Significant Other Suicide Prevention Education  Suicide Prevention Education:  Education Completed; mother [phone] [name],  (name of family member/significant other) has been identified by the patient as the family member/significant other with whom the patient will be residing, and identified as the person(s) who will aid the patient in the event of a mental health crisis (suicidal ideations/suicide attempt).  With written consent from the patient, the family member/significant other has been provided the following suicide prevention education, prior to the and/or following the discharge of the patient.  The suicide prevention education provided includes the following:  Suicide risk factors  Suicide prevention and interventions  National Suicide Hotline telephone number  [facility] assessment telephone number  [facility] Emergency Assistance 911  [facility] and/or Residential Mobile Crisis [facility] telephone number  Request made of family/significant other to:  Remove weapons (e.g., guns, rifles, knives), all items previously/currently identified as safety concern.    Remove drugs/medications (over-the-counter, prescriptions, illicit drugs), all items previously/currently identified as a safety concern.  The family member/significant other verbalizes understanding of the suicide prevention education information provided.  The family member/significant other agrees to remove the items of safety concern listed above.  Luis George, [place] CarboKristin L George, Luis George

## 2015-02-06 NOTE — Progress Notes (Signed)
[facility] MD Progress Note  02/06/2015 4:56 PM Luis George  MRN:  [id number] Subjective:  Luis George was taken to the ED after the seizure scan was normal he states he has no recollection of what happened. He continues to affirm that he was not using benzodiazepines on a regular basis or drinking that much to justify having a withdrawal seizure. The conclusion was that the Vistaril could have caused it. He is doing well now. He states he had a long talk with his family and that they are agreeable to the plan that he pursue outpatient treatment rather than insisting on a 28 day program. Mother is taking Zoloft successfully and Luis George is willing to try it himself Principal Problem: Substance induced mood disorder Diagnosis:   Patient Active Problem List   Diagnosis Date Noted  . Substance induced mood disorder [F19.94] 02/04/2015  . Adjustment disorder with mixed anxiety and depressed mood [F43.23] 02/04/2015  . Polysubstance abuse [F19.10] 02/03/2015   Total Time spent with patient: 30 minutes   Past Medical History: History reviewed. No pertinent past medical history.  Past Surgical History  Procedure Laterality Date  . Orif wrist fracture     Family History: History reviewed. No pertinent family history. Social History:  History  Alcohol Use  . Yes     History  Drug Use  . Yes  . Special: Cocaine    Comment: xanax    History   Social History  . Marital Status: Single    Spouse Name: N/A  . Number of Children: N/A  . Years of Education: N/A   Social History Main Topics  . Smoking status: Never Smoker   . Smokeless tobacco: Never Used  . Alcohol Use: Yes  . Drug Use: Yes    Special: Cocaine     Comment: xanax  . Sexual Activity: Yes   Other Topics Concern  . None   Social History Narrative   Additional History:    Sleep: Fair  Appetite:  Fair   Assessment:   Musculoskeletal: Strength & Muscle Tone: within normal limits Gait & Station: normal Patient leans:  normal   Psychiatric Specialty Exam: Physical Exam  Review of Systems  Constitutional: Negative.   HENT: Negative.   Eyes: Negative.   Respiratory: Negative.   Cardiovascular: Negative.   Gastrointestinal: Negative.   Genitourinary: Negative.   Musculoskeletal: Negative.   Skin: Negative.   Neurological: Negative.   Endo/Heme/Allergies: Negative.   Psychiatric/Behavioral: Positive for substance abuse. The patient is nervous/anxious.     Blood pressure 118/83, pulse 87, temperature 98.3 F (36.8 C), temperature source Oral, resp. rate 16, SpO2 100 %.There is no height or weight on file to calculate BMI.  General Appearance: Fairly Groomed  Engineer, water::  Fair  Speech:  Clear and Coherent  Volume:  Normal  Mood:  Euthymic  Affect:  Appropriate  Thought Process:  Coherent and Goal Directed  Orientation:  Full (Time, Place, and Person)  Thought Content:  events worries cocerns plans as he anticipated being D/C soon  Suicidal Thoughts:  No  Homicidal Thoughts:  No  Memory:  Immediate;   Fair Recent;   Fair Remote;   Fair  Judgement:  Fair  Insight:  Present  Psychomotor Activity:  Normal  Concentration:  Fair  Recall:  [employer] [place]  Language: Fair  Akathisia:  No  Handed:  Right  AIMS (if indicated):     Assets:  Desire for Improvement Housing Social Support Vocational/Educational  ADL's:  Intact  Cognition: WNL  Sleep:  Number of Hours: 6.25     Current Medications: Current Facility-Administered Medications  Medication Dose Route Frequency Provider Last Rate Last Dose  . acetaminophen (TYLENOL) tablet 650 mg  650 mg Oral Q6H PRN Harriet Butte, NP      . alum & mag hydroxide-simeth (MAALOX/MYLANTA) 200-200-20 MG/5ML suspension 30 mL  30 mL Oral Q4H PRN Harriet Butte, NP      . chlordiazePOXIDE (LIBRIUM) capsule 25 mg  25 mg Oral Q6H PRN Harriet Butte, NP      . magnesium hydroxide (MILK OF MAGNESIA) suspension 30 mL  30 mL Oral Daily PRN  Harriet Butte, NP      . traZODone (DESYREL) tablet 50 mg  50 mg Oral QHS PRN Harriet Butte, NP        Lab Results:  Results for orders placed or performed during the hospital encounter of 02/03/15 (from the past 48 hour(s))  Glucose, capillary     Status: Abnormal   Collection Time: 02/05/15  4:57 PM  Result Value Ref Range   Glucose-Capillary 113 (H) 65 - 99 mg/dL  Basic metabolic panel     Status: Abnormal   Collection Time: 02/05/15  6:38 PM  Result Value Ref Range   Sodium 140 135 - 145 mmol/L   Potassium 3.2 (L) 3.5 - 5.1 mmol/L   Chloride 106 101 - 111 mmol/L   CO2 21 (L) 22 - 32 mmol/L   Glucose, Bld 92 65 - 99 mg/dL   BUN 8 6 - 20 mg/dL   Creatinine, Ser 1.10 0.61 - 1.24 mg/dL   Calcium 9.4 8.9 - 10.3 mg/dL   GFR calc non Af Amer >60 >60 mL/min   GFR calc Af Amer >60 >60 mL/min    Comment: (NOTE) The eGFR has been calculated using the CKD EPI equation. This calculation has not been validated in all clinical situations. eGFR's persistently <60 mL/min signify possible Chronic Kidney Disease.    Anion gap 13 5 - 15  CBC     Status: Abnormal   Collection Time: 02/05/15  6:38 PM  Result Value Ref Range   WBC 10.8 (H) 4.0 - 10.5 K/uL   RBC 4.59 4.22 - 5.81 MIL/uL   Hemoglobin 15.1 13.0 - 17.0 g/dL   HCT 41.6 39.0 - 52.0 %   MCV 90.6 78.0 - 100.0 fL   MCH 32.9 26.0 - 34.0 pg   MCHC 36.3 (H) 30.0 - 36.0 g/dL   RDW 12.5 11.5 - 15.5 %   Platelets 219 150 - 400 K/uL  CK     Status: None   Collection Time: 02/05/15  6:38 PM  Result Value Ref Range   Total CK 150 49 - 397 U/L  Ethanol     Status: None   Collection Time: 02/05/15  6:38 PM  Result Value Ref Range   Alcohol, Ethyl (B) <5 <5 mg/dL    Comment:        LOWEST DETECTABLE LIMIT FOR SERUM ALCOHOL IS 5 mg/dL FOR MEDICAL PURPOSES ONLY   Urinalysis, Routine w reflex microscopic (not at [facility])     Status: Abnormal   Collection Time: 02/05/15  8:11 PM  Result Value Ref Range   Color, Urine YELLOW YELLOW    APPearance CLOUDY (A) CLEAR   Specific Gravity, Urine 1.021 1.005 - 1.030   pH 5.0 5.0 - 8.0   Glucose, UA NEGATIVE NEGATIVE mg/dL   Hgb urine dipstick SMALL (  A) NEGATIVE   Bilirubin Urine NEGATIVE NEGATIVE   Ketones, ur NEGATIVE NEGATIVE mg/dL   Protein, ur 100 (A) NEGATIVE mg/dL   Urobilinogen, UA 0.2 0.0 - 1.0 mg/dL   Nitrite NEGATIVE NEGATIVE   Leukocytes, UA NEGATIVE NEGATIVE  Urine culture     Status: None (Preliminary result)   Collection Time: 02/05/15  8:11 PM  Result Value Ref Range   Specimen Description URINE, CATHETERIZED    Special Requests NONE    Culture NO GROWTH < 12 HOURS    Report Status PENDING   Strep pneumoniae urinary antigen     Status: None   Collection Time: 02/05/15  8:11 PM  Result Value Ref Range   Strep Pneumo Urinary Antigen NEGATIVE NEGATIVE    Comment:        Infection due to S. pneumoniae cannot be absolutely ruled out since the antigen present may be below the detection limit of the test.   Legionella antigen, urine     Status: None   Collection Time: 02/05/15  8:11 PM  Result Value Ref Range   Specimen Description URINE, CATHETERIZED    Special Requests NONE    Legionella Antigen, Urine      Negative for Legionella pneumophila serogroup 1                                                              Legionella pneumophila serogroup 1 antigen can be detected in urine within 2 to 3 days of infection and may persist even after treatment. This  assay does not detect other Legionella species or serogroups. Performed at [facility]    Report Status 02/06/2015 FINAL   Urine microscopic-add on     Status: None   Collection Time: 02/05/15  8:11 PM  Result Value Ref Range   Squamous Epithelial / LPF RARE RARE   WBC, UA 0-2 <3 WBC/hpf   RBC / HPF 0-2 <3 RBC/hpf   Bacteria, UA RARE RARE   Sperm, UA PRESENT     Physical Findings: AIMS: Facial and Oral Movements Muscles of Facial Expression: None, normal Lips and Perioral Area: None,  normal Jaw: None, normal Tongue: None, normal,Extremity Movements Upper (arms, wrists, hands, fingers): None, normal Lower (legs, knees, ankles, toes): None, normal, Trunk Movements Neck, shoulders, hips: None, normal, Overall Severity Severity of abnormal movements (highest score from questions above): None, normal Incapacitation due to abnormal movements: None, normal Patient's awareness of abnormal movements (rate only patient's report): No Awareness, Dental Status Current problems with teeth and/or dentures?: No Does patient usually wear dentures?: No  CIWA:  CIWA-Ar Total: 0 COWS:  COWS Total Score: 0  Treatment Plan Summary: Daily contact with patient to assess and evaluate symptoms and progress in treatment and Medication management Supportive approach/coping skills Polysubstance abuse; continue to work a relapse prevention plan Anxiety; will give Zoloft a try. Will hold starting it today given his experience with the Vistaril.  Will use CBT/mindfulness   Medical Decision Making:  Review of Psycho-Social Stressors (1), Review or order clinical lab tests (1) and Review of New Medication or Change in Dosage (2)     Taray Normoyle A 02/06/2015, 4:56 PM

## 2015-02-06 NOTE — Plan of Care (Signed)
Problem: Ineffective individual coping Goal: STG-Increase in ability to manage activities of daily living Outcome: Completed/Met Date Met:  02/06/15 Patient completes ADLs and is self sufficient.  Problem: Diagnosis: Increased Risk For Suicide Attempt Goal: STG-Patient Will Report Suicidal Feelings to Staff Outcome: Progressing No SI voiced, denies urge to harm self.

## 2015-02-06 NOTE — [facility] Group Notes (Signed)
[facility] LCSW Group Therapy 02/06/2015 1:15 PM Type of Therapy: Group Therapy Participation Level: Active  Participation Quality: Attentive, Sharing and Supportive  Affect: Appropriate  Cognitive: Alert and Oriented  Insight: Developing/Improving and Engaged  Engagement in Therapy: Developing/Improving and Engaged  Modes of Intervention: Clarification, Confrontation, Discussion, Education, Exploration, Limit-setting, Orientation, Problem-solving, Rapport Building, Dance movement psychotherapisteality Testing, Socialization and Support  Summary of Progress/Problems: The topic for today was feelings about relapse. Pt discussed what relapse prevention is to them and identified triggers that they are on the path to relapse. Pt processed their feeling towards relapse and was able to relate to peers. Pt discussed coping skills that can be used for relapse prevention. Patient identified drinking alcohol and hanging around with friends who like to drink or use as relapse behaviors. Patient reports that he has several friends and family members who are positive supports for him. CSW and other group members provided patient with emotional support and encouragement.   Samuella BruinKristin Nykeria Mealing, MSW, [employer]CS[employer] Clinical Social Worker [facility] [phone]

## 2015-02-06 NOTE — Progress Notes (Signed)
Met and assessed patient at start of shift. Patient alert and oriented. Denying pain in head or elsewhere. Denies soreness from seizure. States the ED staff told him they believe his seizure was related to the vistaril and advised him not to take it (allergy list updated). Patient states he does not remember the seizure nor any aura prior to. Remembers meeting with Dr. Lugo and being quite agitated and upset. States he does remember waking up in ambulance. Reports family came and stayed with him all night and is now open to patient following up OP only. Continues to state his alcohol use is "social" and minimal. Also continues to state his cocaine and benzo use was a one time event on the night he made SI statements. MD and CM updated. Patient given support, reassurance. High fall risk precautions in place and explained. No meds ordered. Patient verbalizes understanding. Denies any withdrawal or need for prn meds. VS obtained per post fall protocol and have been stable. Denies SI/HI. Will continue to monitor closely. ,  [place]akes  

## 2015-02-06 NOTE — Progress Notes (Signed)
D: Patient seen sitting on day room watching TV. No interaction. Pleasant and appropriate. Patient denies pain, SI/HI, AH/VH at this time. Patient made no new complaint.  A: Patient encouraged to continue with the treatment plan and to verbalize needs to staff. Every 15 minutes check for safety maintained. Will continue to monitor patient for safety and stability.  R: Patient remains safe.

## 2015-02-06 NOTE — Tx Team (Signed)
Interdisciplinary Treatment Plan Update (Adult) Date: 02/06/2015   Time Reviewed: 9:30 AM  Progress in Treatment: Attending groups: Minimally Participating in groups: Minimally Taking medication as prescribed: Yes Tolerating medication: Yes Family/Significant other contact made: Yes, CSW has spoken with patient's mother Patient understands diagnosis: Yes Discussing patient identified problems/goals with staff: Yes Medical problems stabilized or resolved: Yes Denies suicidal/homicidal ideation: Yes Issues/concerns per patient self-inventory: Yes Other:  New problem(s) identified: N/A  Discharge Plan or Barriers:   02/06/2015: Patient would like to return home and follow up with outpatient services. Patient's mother wants him to go to a residential program at discharge and it is unclear if he is able to return home with family at this time.  Reason for Continuation of Hospitalization:  Depression Anxiety Medication Stabilization   Comments: N/A  Estimated length of stay: 1-2 days  For review of initial/current patient goals, please see plan of care. Patient was pleasant and engaged throughout assessment. He showed signs of extreme anxiety and worry as well becoming tearful at times. "I haven't even cried since I got here". He reports having a lot of anxiety and worry related to a recent break up (he broke up with his GF of 1 year because of arguing all the time), several close friends becoming upset and accusing him of things based on a picture on social media and an argument with his father.  Patient admits to being "embarrassed" by what has gone on (police cars at parents house to IVC him- friends saw this), letting his "emotions" get the best of him and feeling out of control. He is open to seeing a therapist as an outpatient and reports plans were in place for him to see someone today before this crisis and hospitalization occurred.    Attendees: Patient:    Family:     Physician: Dr. Jama Flavorsobos; Dr. Dub MikesLugo 02/06/2015 9:30 AM  Nursing: Joslyn Devonaroline Beaudry, [address]Mari[address] Earleen NewportFriedman, Beverly Knight , RN 02/06/2015 9:30 AM  Clinical Social Worker: Samuella BruinKristin Darielys Giglia,  LCSWA 02/06/2015 9:30 AM  Other: Chad CordialLauren Carter, LCSWA  02/06/2015 9:30 AM  Other: Cleopatra Cedareggie King, [facility] Liaison 02/06/2015 9:30 AM  Other: Onnie BoerJennifer Clark, Case Manager 02/06/2015 9:30 AM  Other: Mosetta AnisAggie Nwoko, Laura Davis NP 02/06/2015 9:30 AM  Other: Santa GeneraAnne Cunningham, LCSW 02/06/2015 9:30 AM  Other:    Other:    Other:      Scribe for Treatment Team:  Samuella BruinKristin Leotha Voeltz, MSW, [employer]CS[employer] [phone]

## 2015-02-07 LAB — URINE CULTURE: CULTURE: NO GROWTH

## 2015-02-07 MED ORDER — TRAZODONE HCL 50 MG PO TABS: 50.0000 mg | ORAL_TABLET | Freq: Every evening | ORAL | Status: AC | PRN

## 2015-02-07 MED ORDER — SERTRALINE HCL 25 MG PO TABS: 25.0000 mg | ORAL_TABLET | Freq: Every day | ORAL | Status: AC

## 2015-02-07 NOTE — Progress Notes (Signed)
Discharge instructions/medications/follow up appointments discussed with pt. Prescriptions given, samples given, and patients belongings returned to pt.   Pt verbalizes understanding.  Pt denies SI/HI/AVH.  Pt reports feeling better and will be living at mom's house and looking forward to going to college in the fall for healthcare. Denies any pain, mom pick up pt.

## 2015-02-07 NOTE — Discharge Summary (Signed)
Physician Discharge Summary Note  Patient:  Luis George is an 19 y.o., male MRN:  [id number] DOB:  11-Aug-1995 Patient phone:  [phone] (home)  Patient address:   [place] [address],  Total Time spent with patient: 30 minutes  Date of Admission:  02/03/2015 Date of Discharge: 02/07/15  Reason for Admission:  Substance abuse  Principal Problem: Substance induced mood disorder Discharge Diagnoses: Patient Active Problem List   Diagnosis Date Noted  . Substance induced mood disorder [F19.94] 02/04/2015  . Adjustment disorder with mixed anxiety and depressed mood [F43.23] 02/04/2015  . Polysubstance abuse [F19.10] 02/03/2015    Musculoskeletal: Strength & Muscle Tone: within normal limits Gait & Station: normal Patient leans: N/A  Psychiatric Specialty Exam: Physical Exam  Psychiatric: He has a normal mood and affect. His speech is normal and behavior is normal. Judgment and thought content normal. Cognition and memory are normal.    Review of Systems  Constitutional: Negative.   HENT: Negative.   Eyes: Negative.   Respiratory: Negative.   Cardiovascular: Negative.   Gastrointestinal: Negative.   Genitourinary: Negative.   Musculoskeletal: Negative.   Skin: Negative.   Neurological: Negative.   Endo/Heme/Allergies: Negative.   Psychiatric/Behavioral: Positive for substance abuse (Prior to [employer]). Negative for depression, suicidal ideas, hallucinations and memory loss. The patient is not nervous/anxious and does not have insomnia.     Blood pressure 114/81, pulse 105, temperature 97.6 F (36.4 C), temperature source Oral, resp. rate 16, SpO2 100 %.There is no height or weight on file to calculate BMI.  See Physician SRA     Have you used any form of tobacco in the last 30 days? (Cigarettes, Smokeless Tobacco, Cigars, and/or Pipes): No  Has this patient used any form of tobacco in the last 30 days? (Cigarettes, Smokeless Tobacco, Cigars, and/or  Pipes) No  Past Medical History: History reviewed. No pertinent past medical history.  Past Surgical History  Procedure Laterality Date  . Orif wrist fracture     Family History: History reviewed. No pertinent family history. Social History:  History  Alcohol Use  . Yes     History  Drug Use  . Yes  . Special: Cocaine    Comment: xanax    History   Social History  . Marital Status: Single    Spouse Name: N/A  . Number of Children: N/A  . Years of Education: N/A   Social History Main Topics  . Smoking status: Never Smoker   . Smokeless tobacco: Never Used  . Alcohol Use: Yes  . Drug Use: Yes    Special: Cocaine     Comment: xanax  . Sexual Activity: Yes   Other Topics Concern  . None   Social History Narrative   Risk to Self: Is patient at risk for suicide?: No (Pt denies SI ideation, Sitter from [facility] at the bedside. ) What has been your use of drugs/alcohol within the last 12 months?:  (occassional etoh and THC use- one time use of cocaine this week) Risk to Others:   Prior Inpatient Therapy:   Prior Outpatient Therapy:    Level of Care:  OP  Hospital Course:    Matty Vanroekel is a 19 year old male who states he had a good friendship end, a recent break up with his GF. Patient stated he got really upset. States the friend thought he was trying to get together with one of his ex GF. States this was very upsetting. States that he has  conflictive interactions with his father. States he drinks few times a week. He was drinking dealing with the loss of the relationship with his GF when he got the news of his best friend terminating their relationship. He admits he was upset. States he drinks couple of beers and he is fine. States he was feeling down and thought that using some cocaine would help. Claims he does not use it on a regular basis. At school he is busy with school work does not drink that much. States that he did cocaine two days before he came here. He also  minimizes his use of Benzodiazepines.  Denies feeling suicidal. Patient admitted that whatever he might have said was under the influence.          Anvay Tennis was admitted to the adult 300 unit. He was evaluated and his symptoms were identified. His urine drug screen was positive for cocaine, marijuana, and benzo.  Medication management was discussed and initiated. Patient was started on Zoloft 25 mg daily for depression. He was ordered librium on a prn basis for possible symptoms of benzo withdrawal. After a witnessed seizure there was concern that the patient had been underreporting his use of benzodiazepines. This happened while the patient was talking with Dr. Sabra Heck in his office. After he was given Ativan 2 mg IM the seizure resolved spontaneously per notes. At the ED his neuro exam was negative and he was sent back to [facility]. There were no further incidence of seizure during his admission at [facility].  He was oriented to the unit and encouraged to participate in unit programming. Medical problems were identified and treated appropriately. Home medication was restarted as needed.        The patient was evaluated each day by a clinical provider to ascertain the patient's response to treatment.  Improvement was noted by the patient's report of decreasing symptoms, improved sleep and appetite, affect, medication tolerance, behavior, and participation in unit programming.  He was asked each day to complete a self inventory noting mood, mental status, pain, new symptoms, anxiety and concerns.         He responded well to medication and being in a therapeutic and supportive environment. Positive and appropriate behavior was noted and the patient was motivated for recovery.  The patient worked closely with the treatment team and case manager to develop a discharge plan with appropriate goals. Coping skills, problem solving as well as relaxation therapies were also part of the unit programming. Patient reported anxiety from  conflictual relationships with family. They were encouraging him to attend rehab but the patient denied having a substance abuse problem. Patient expressed concern that going to rehab would affect him going to [employer] next semester. He continued to express difficulty dealing with the break up with his ex girlfriend.          By the day of discharge he was in much improved condition than upon admission.  Symptoms were reported as significantly decreased or resolved completely. The patient denied SI/HI and voiced no AVH. He was motivated to continue taking medication with a goal of continued improvement in mental health.  Tra Wilemon was discharged home with a plan to follow up as noted below. The patient was provided with sample medications and prescriptions at time of discharge. He left [facility] in stable condition with all belongings returned to him.   Consults:  psychiatry  Significant Diagnostic Studies:  Chemistry panel, CBC, UA, UDS positive for cocaine and marijuana  Discharge Vitals:   Blood pressure 114/81, pulse 105, temperature 97.6 F (36.4 C), temperature source Oral, resp. rate 16, SpO2 100 %. There is no height or weight on file to calculate BMI. Lab Results:   Results for orders placed or performed during the hospital encounter of 02/03/15 (from the past 72 hour(s))  Glucose, capillary     Status: Abnormal   Collection Time: 02/05/15  4:57 PM  Result Value Ref Range   Glucose-Capillary 113 (H) 65 - 99 mg/dL  Basic metabolic panel     Status: Abnormal   Collection Time: 02/05/15  6:38 PM  Result Value Ref Range   Sodium 140 135 - 145 mmol/L   Potassium 3.2 (L) 3.5 - 5.1 mmol/L   Chloride 106 101 - 111 mmol/L   CO2 21 (L) 22 - 32 mmol/L   Glucose, Bld 92 65 - 99 mg/dL   BUN 8 6 - 20 mg/dL   Creatinine, Ser 1.10 0.61 - 1.24 mg/dL   Calcium 9.4 8.9 - 10.3 mg/dL   GFR calc non Af Amer >60 >60 mL/min   GFR calc Af Amer >60 >60 mL/min    Comment: (NOTE) The eGFR has been  calculated using the CKD EPI equation. This calculation has not been validated in all clinical situations. eGFR's persistently <60 mL/min signify possible Chronic Kidney Disease.    Anion gap 13 5 - 15  CBC     Status: Abnormal   Collection Time: 02/05/15  6:38 PM  Result Value Ref Range   WBC 10.8 (H) 4.0 - 10.5 K/uL   RBC 4.59 4.22 - 5.81 MIL/uL   Hemoglobin 15.1 13.0 - 17.0 g/dL   HCT 41.6 39.0 - 52.0 %   MCV 90.6 78.0 - 100.0 fL   MCH 32.9 26.0 - 34.0 pg   MCHC 36.3 (H) 30.0 - 36.0 g/dL   RDW 12.5 11.5 - 15.5 %   Platelets 219 150 - 400 K/uL  CK     Status: None   Collection Time: 02/05/15  6:38 PM  Result Value Ref Range   Total CK 150 49 - 397 U/L  Ethanol     Status: None   Collection Time: 02/05/15  6:38 PM  Result Value Ref Range   Alcohol, Ethyl (B) <5 <5 mg/dL    Comment:        LOWEST DETECTABLE LIMIT FOR SERUM ALCOHOL IS 5 mg/dL FOR MEDICAL PURPOSES ONLY   Urinalysis, Routine w reflex microscopic (not at [facility])     Status: Abnormal   Collection Time: 02/05/15  8:11 PM  Result Value Ref Range   Color, Urine YELLOW YELLOW   APPearance CLOUDY (A) CLEAR   Specific Gravity, Urine 1.021 1.005 - 1.030   pH 5.0 5.0 - 8.0   Glucose, UA NEGATIVE NEGATIVE mg/dL   Hgb urine dipstick SMALL (A) NEGATIVE   Bilirubin Urine NEGATIVE NEGATIVE   Ketones, ur NEGATIVE NEGATIVE mg/dL   Protein, ur 100 (A) NEGATIVE mg/dL   Urobilinogen, UA 0.2 0.0 - 1.0 mg/dL   Nitrite NEGATIVE NEGATIVE   Leukocytes, UA NEGATIVE NEGATIVE  Urine culture     Status: None   Collection Time: 02/05/15  8:11 PM  Result Value Ref Range   Specimen Description URINE, CATHETERIZED    Special Requests NONE    Culture NO GROWTH 2 DAYS    Report Status 02/07/2015 FINAL   Strep pneumoniae urinary antigen     Status: None   Collection Time: 02/05/15  8:11 PM  Result Value Ref Range   Strep Pneumo Urinary Antigen NEGATIVE NEGATIVE    Comment:        Infection due to S. pneumoniae cannot be absolutely  ruled out since the antigen present may be below the detection limit of the test.   Legionella antigen, urine     Status: None   Collection Time: 02/05/15  8:11 PM  Result Value Ref Range   Specimen Description URINE, CATHETERIZED    Special Requests NONE    Legionella Antigen, Urine      Negative for Legionella pneumophila serogroup 1                                                              Legionella pneumophila serogroup 1 antigen can be detected in urine within 2 to 3 days of infection and may persist even after treatment. This  assay does not detect other Legionella species or serogroups. Performed at [facility]    Report Status 02/06/2015 FINAL   Urine microscopic-add on     Status: None   Collection Time: 02/05/15  8:11 PM  Result Value Ref Range   Squamous Epithelial / LPF RARE RARE   WBC, UA 0-2 <3 WBC/hpf   RBC / HPF 0-2 <3 RBC/hpf   Bacteria, UA RARE RARE   Sperm, UA PRESENT     Physical Findings: AIMS: Facial and Oral Movements Muscles of Facial Expression: None, normal Lips and Perioral Area: None, normal Jaw: None, normal Tongue: None, normal,Extremity Movements Upper (arms, wrists, hands, fingers): None, normal Lower (legs, knees, ankles, toes): None, normal, Trunk Movements Neck, shoulders, hips: None, normal, Overall Severity Severity of abnormal movements (highest score from questions above): None, normal Incapacitation due to abnormal movements: None, normal Patient's awareness of abnormal movements (rate only patient's report): No Awareness, Dental Status Current problems with teeth and/or dentures?: No Does patient usually wear dentures?: No  CIWA:  CIWA-Ar Total: 0 COWS:  COWS Total Score: 0   See Psychiatric Specialty Exam and Suicide Risk Assessment completed by Attending Physician prior to discharge.  Discharge destination:  Home  Is patient on multiple antipsychotic therapies at discharge:  No   Has Patient had three or more  failed trials of antipsychotic monotherapy by history:  No  Recommended Plan for Multiple Antipsychotic Therapies: NA     Medication List    TAKE these medications      Indication   sertraline 25 MG tablet  Commonly known as:  ZOLOFT  Take 1 tablet (25 mg total) by mouth daily.   Indication:  Major Depressive Disorder     traZODone 50 MG tablet  Commonly known as:  DESYREL  Take 1 tablet (50 mg total) by mouth at bedtime as needed for sleep.   Indication:  Trouble Sleeping       Follow-up Information    Follow up with [facility].   Why:  Please call to schedule appointment for medication management services- will need to provide a deposit to schedule appointment.   Contact information:   [address],  [place],  [address] Phone: [phone]      Follow up with [facility].   Specialty:  Emergency Medicine   Why:  If symptoms worsen, As  needed   Contact information:   [address] [id number] [place]      Follow up with Jackelyn Hoehn On 02/23/2015.   Why:  Therapy appointment with provider who specializes in treatment of anxiety disorders on Monday August 1st at 2pm. Please bring insurance card and call office if you need to reschedule.   Contact information:   [address] [place], [place] [address] [phone] Fax [phone]       Follow-up recommendations:    Activity: as tolerated Diet: regular Follow up as above  Comments:   Take all your medications as prescribed by your mental healthcare provider.  Report any adverse effects and or reactions from your medicines to your outpatient provider promptly.  Patient is instructed and cautioned to not engage in alcohol and or illegal drug use while on prescription medicines.  In the event of worsening symptoms, patient is instructed to call the crisis hotline, 911 and or go to the nearest ED for  appropriate evaluation and treatment of symptoms.  Follow-up with your primary care provider for your other medical issues, concerns and or health care needs.   Total Discharge Time: Greater than 30 minutes  Signed: Elmarie Shiley, NP-C 02/07/2015, 7:32 PM  I personally assessed the patient and formulated the plan Geralyn Flash A. Sabra Heck, M.D.

## 2015-02-07 NOTE — [facility] Group Notes (Signed)
The focus of this group is to educate the patient on the purpose and policies of crisis stabilization and provide a format to answer questions about their admission.  The group details unit policies and expectations of patients while admitted. Pt attended 0900 nsg orientation group. Activity participated and was able to identify 3 goals for discharge. Pt was actively engaged and showed appropriate  insight.

## 2015-02-07 NOTE — [facility] Suicide Risk Assessment (Signed)
[facility] Discharge Suicide Risk Assessment   Demographic Factors:  Male and Caucasian  Total Time spent with patient: 30 minutes  Musculoskeletal: Strength & Muscle Tone: within normal limits Gait & Station: normal Patient leans: normal  Psychiatric Specialty Exam: Physical Exam  Review of Systems  Constitutional: Negative.   HENT: Negative.   Eyes: Negative.   Respiratory: Negative.   Cardiovascular: Negative.   Gastrointestinal: Negative.   Genitourinary: Negative.   Musculoskeletal: Negative.   Skin: Negative.   Neurological: Negative.   Endo/Heme/Allergies: Negative.   Psychiatric/Behavioral: Negative.     Blood pressure 114/81, pulse 105, temperature 97.6 F (36.4 C), temperature source Oral, resp. rate 16, SpO2 100 %.There is no height or weight on file to calculate BMI.  General Appearance: Fairly Groomed  Patent attorney::  Fair  Speech:  Clear and Coherent409  Volume:  Increased  Mood:  Euthymic  Affect:  Appropriate  Thought Process:  Coherent and Goal Directed  Orientation:  Full (Time, Place, and Person)  Thought Content:  plans as he moves on relapse prevention plan  Suicidal Thoughts:  No  Homicidal Thoughts:  No  Memory:  Immediate;   Fair Recent;   Fair Remote;   Fair  Judgement:  Fair  Insight:  Present  Psychomotor Activity:  Normal  Concentration:  Fair  Recall:  [employer] of Knowledge:Fair  Language: Fair  Akathisia:  No  Handed:  Right  AIMS (if indicated):     Assets:  Desire for Improvement Housing Social Support Vocational/Educational  Sleep:  Number of Hours: 6.5  Cognition: WNL  ADL's:  Intact   Have you used any form of tobacco in the last 30 days? (Cigarettes, Smokeless Tobacco, Cigars, and/or Pipes): No  Has this patient used any form of tobacco in the last 30 days? (Cigarettes, Smokeless Tobacco, Cigars, and/or Pipes) No  Mental Status Per Nursing Assessment::   On Admission:  Suicidal ideation indicated by others, Self-harm  thoughts, Self-harm behaviors  Current Mental Status by Physician: In full contact with reality. There are no active S/S of withdrawal. There are no active SI plans or intent. He states he is feeling well. He is committed to abstinence, to let the relationship with his ex GF go completely, to not contact her, to start seeing a therapist and to take the Zoloft. He knows that he has to gain the trust of his parents back. He is considering transferring out [place]   Loss Factors: Loss of significant relationship  Historical Factors: NA  Risk Reduction Factors:   Sense of responsibility to family, Living with another person, especially a relative and Positive social support  Continued Clinical Symptoms:  Alcohol/Substance Abuse/Dependencies  Cognitive Features That Contribute To Risk:  None    Suicide Risk:  Minimal: No identifiable suicidal ideation.  Patients presenting with no risk factors but with morbid ruminations; may be classified as minimal risk based on the severity of the depressive symptoms  Principal Problem: Substance induced mood disorder Discharge Diagnoses:  Patient Active Problem List   Diagnosis Date Noted  . Substance induced mood disorder [F19.94] 02/04/2015  . Adjustment disorder with mixed anxiety and depressed mood [F43.23] 02/04/2015  . Polysubstance abuse [F19.10] 02/03/2015    Follow-up Information    Follow up with [facility].   Why:  Please call to schedule appointment for medication management services- will need to provide a deposit to schedule appointment.   Contact information:   [address],  [place], [place] [address]  Phone: [phone]      Follow up with [facility].   Specialty:  Emergency Medicine   Why:  If symptoms worsen, As needed   Contact information:   [address] [id number] [place] [phone]      Follow up with Vernell LeepKen Frazier On  02/23/2015.   Why:  Therapy appointment with provider who specializes in treatment of anxiety disorders on Monday August 1st at 2pm. Please bring insurance card and call office if you need to reschedule.   Contact information:   [address] [place], [place] [address] [phone] Fax [phone]       Plan Of Care/Follow-up recommendations:  Activity:  as tolerated Diet:  regular Follow up as above Is patient on multiple antipsychotic therapies at discharge:  No   Has Patient had three or more failed trials of antipsychotic monotherapy by history:  No  Recommended Plan for Multiple Antipsychotic Therapies: NA    Williamson Cavanah A 02/07/2015, 11:26 AM

## 2015-02-07 NOTE — [facility] Group Notes (Signed)
[facility] Group Notes:  (Clinical Social Work)  02/07/2015     10-11AM  Summary of Progress/Problems:   The main focus of today's process group was to learn how to use a decisional balance exercise to move forward in the Stages of Change, which were described and discussed.  Motivational Interviewing and a worksheet were utilized to help patients explore in depth the perceived benefits and costs of unhealthy coping techniques, as well as the  benefits and costs of replacing that with a healthy coping skills.   The patient expressed his unhealthy coping involves sleeping too much to avoid thinking, and a healthy coping technique he will sometimes use is exercise.  He did not talk much in group, was blunted and depressed in affect.  Type of Therapy:  Group Therapy - Process   Participation Level:  Minimal  Participation Quality:  Attentive  Affect:  Blunted and Depressed  Cognitive:  Alert  Insight:  Developing/Improving  Engagement in Therapy:  Developing/Improving  Modes of Intervention:  Education, Motivational Interviewing  Ambrose MantleMareida Grossman-Orr, LCSW 02/07/2015, 11:56 AM

## 2015-02-07 NOTE — Progress Notes (Signed)
  [facility]  Will you be returning to the same living situation after discharge:  Yes,  with parent At discharge, do you have transportation home?: Yes,  ride to pick up Do you have the ability to pay for your medications: Yes,  no issues expressed  Release of information consent forms completed and in the chart;  Patient's signature needed at discharge.  Patient to Follow up at: Follow-up Information    Follow up with [facility].   Why:  Please call to schedule appointment for medication management services- will need to provide a deposit to schedule appointment.   Contact information:   [address],  [place], [place] [address] Phone: [phone]      [facility].   Specialty:  Emergency Medicine   Why:  If symptoms worsen, As needed   Contact information:   [address] [id number] mc [place] [id number] [phone]      Follow up with Vernell LeepKen Frazier On 02/23/2015.   Why:  Therapy appointment with provider who specializes in treatment of anxiety disorders on Monday August 1st at 2pm. Please bring insurance card and call office if you need to reschedule.   Contact information:   [address] [place], [place] [address] [phone] Fax [phone]       Patient denies SI/HI: Yes,  see doctor's SRA at discharge    Safety Planning and Suicide Prevention discussed: Yes,  with patient and with mother  Have you used any form of tobacco in the last 30 days? (Cigarettes, Smokeless Tobacco, Cigars, and/or Pipes): No  Has patient been referred to the Quitline?: Patient refused referral  Sarina SerGrossman-Orr, Chee Dimon Jo 02/07/2015, 12:53 PM

## 2015-12-25 IMAGING — CT CT HEAD W/O CM
2 series · 16 of 30 positions shown, 20 images · non-contrast
Comparison: None.

CLINICAL DATA: New onset seizures

EXAM:
CT HEAD WITHOUT CONTRAST
TECHNIQUE: Contiguous axial images were obtained from the base of the skull
through the vertex without intravenous contrast.

[Series 201: head w/o, idose (1) · axial · non-contrast · 0.49mm/px · z∈[+113,+233]mm · 13 of 28 slices shown, 17 images]
[im 2/28  brain]
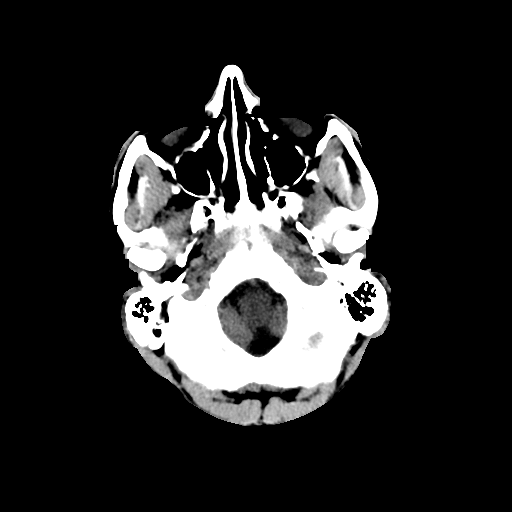
[im 2/28  bone]
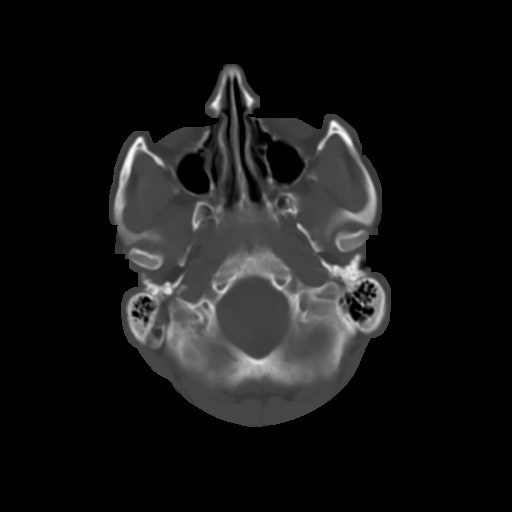
[im 4/28  brain]
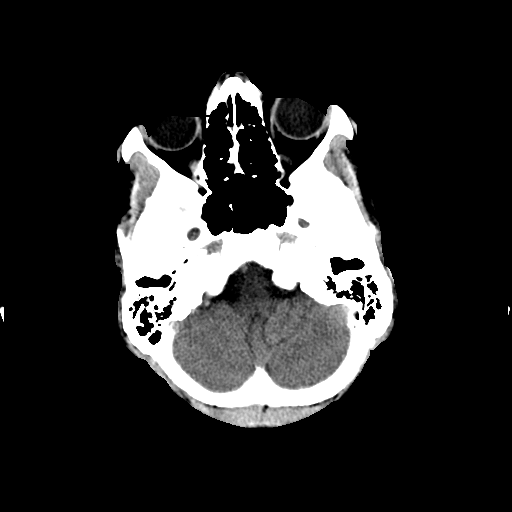
[im 6/28  brain]
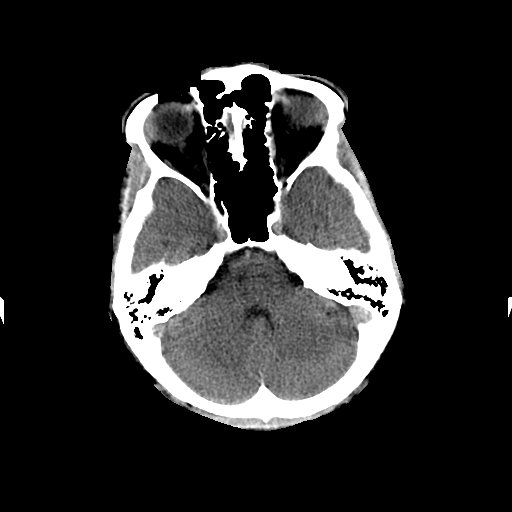
[im 8/28  brain]
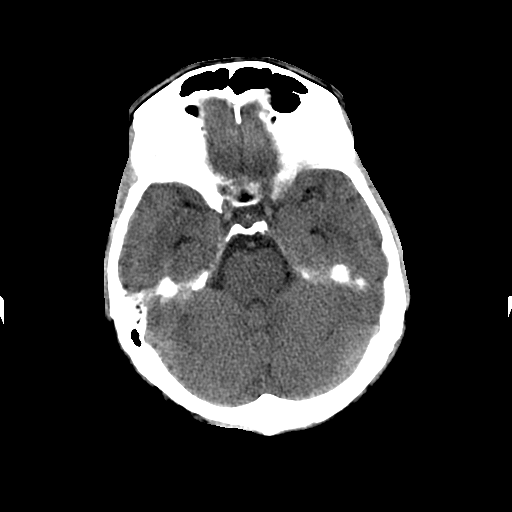
[im 10/28  brain]
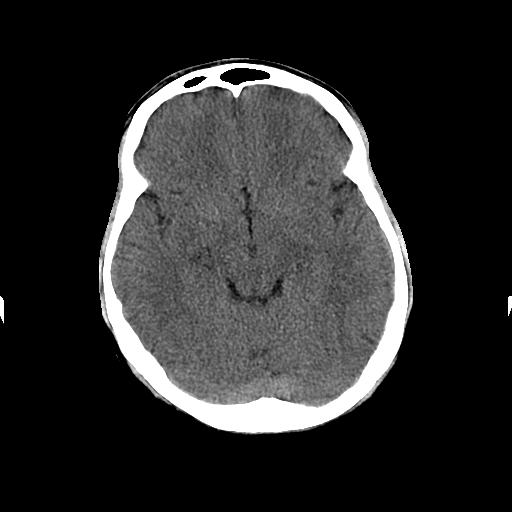
[im 10/28  bone]
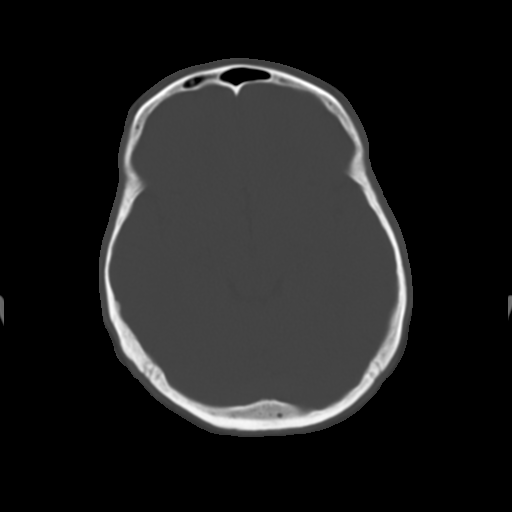
[im 12/28  brain]
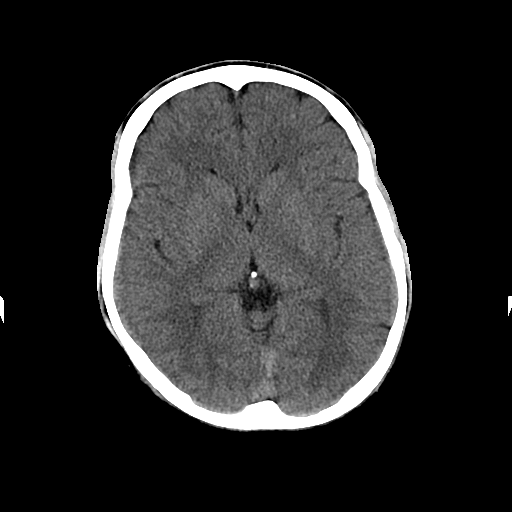
[im 14/28  brain]
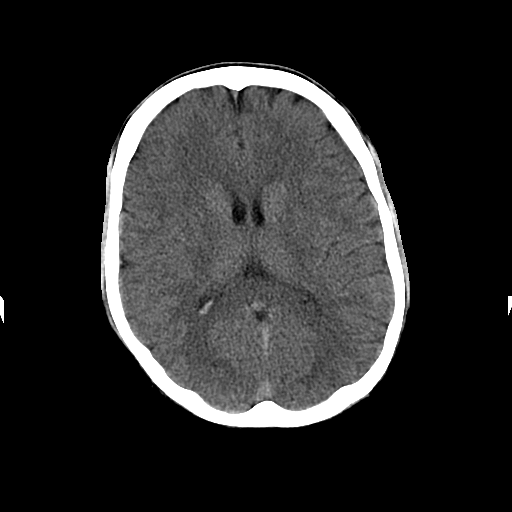
[im 16/28  brain]
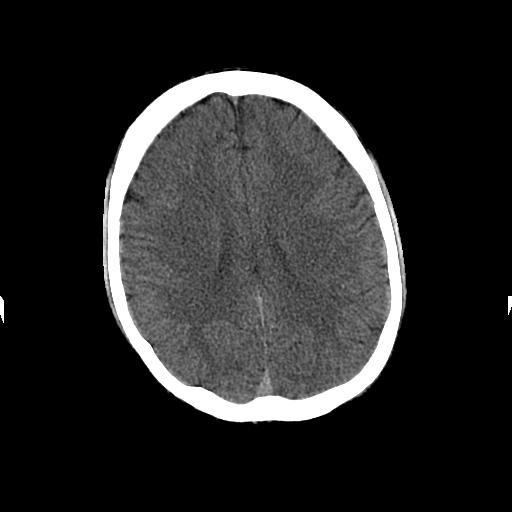
[im 18/28  brain]
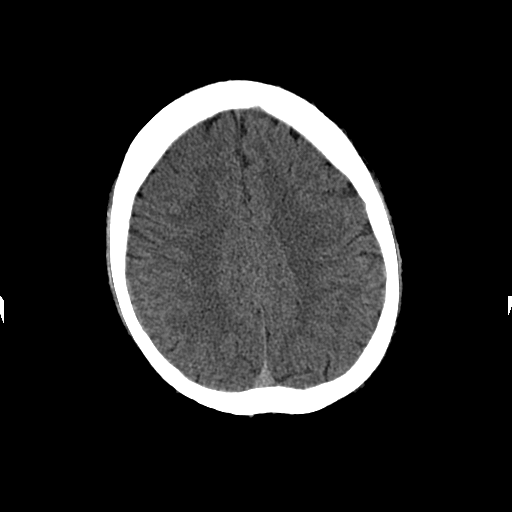
[im 18/28  bone]
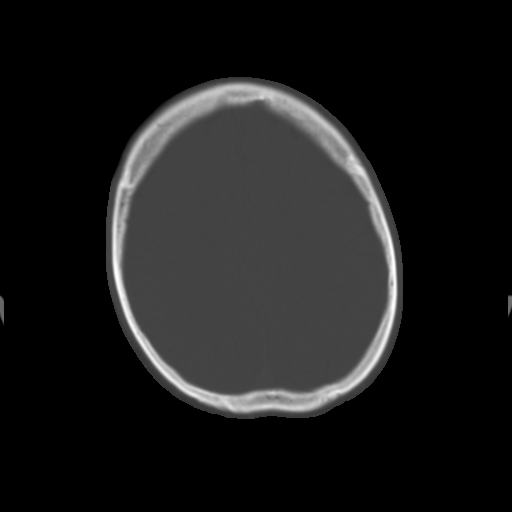
[im 20/28  brain]
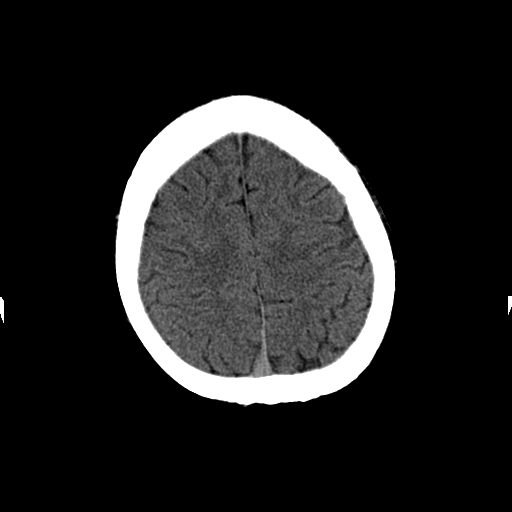
[im 22/28  brain]
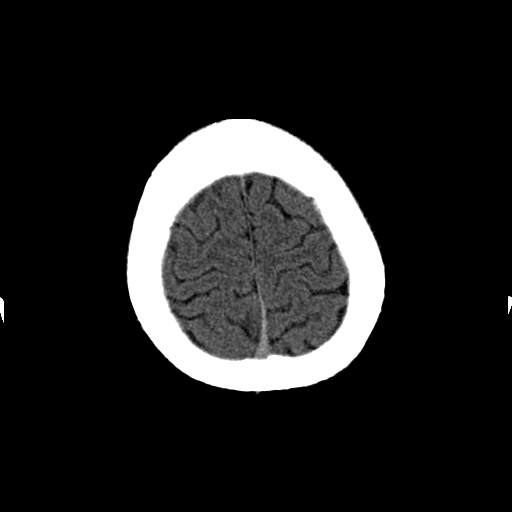
[im 24/28  brain]
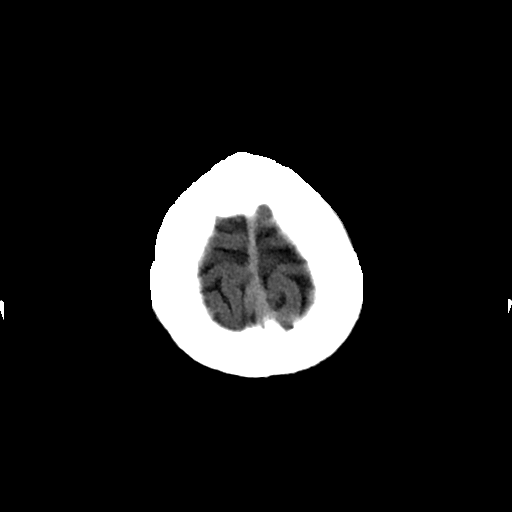
[im 26/28  brain]
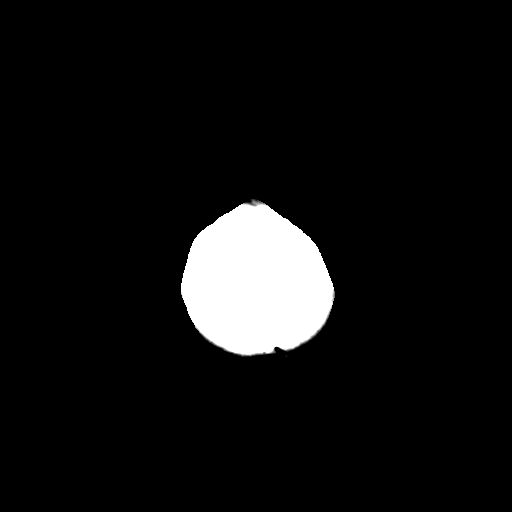
[im 26/28  bone]
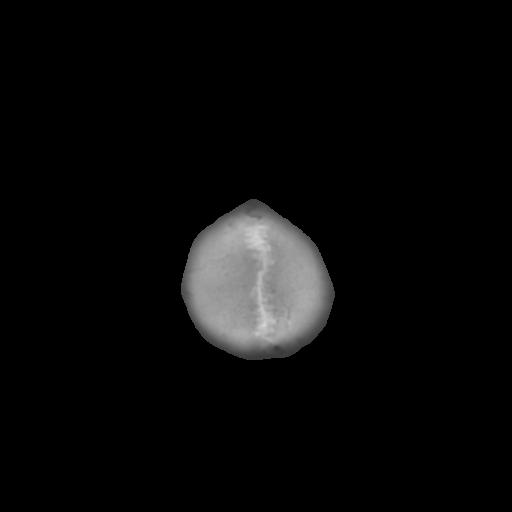

[Series 202: head w/o bone, idose (1) · axial · non-contrast · 0.49mm/px · z∈[+113,+153]mm · 3 of 28 slices shown]
[im 2/28  bone]
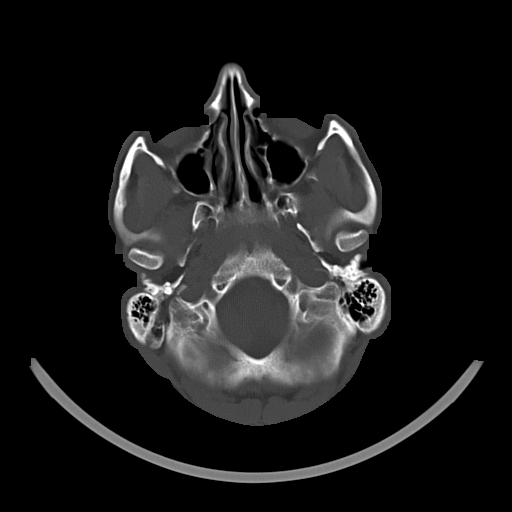
[im 6/28  bone]
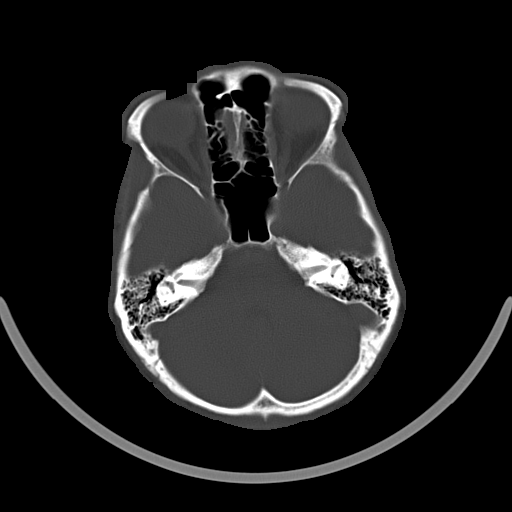
[im 10/28  bone]
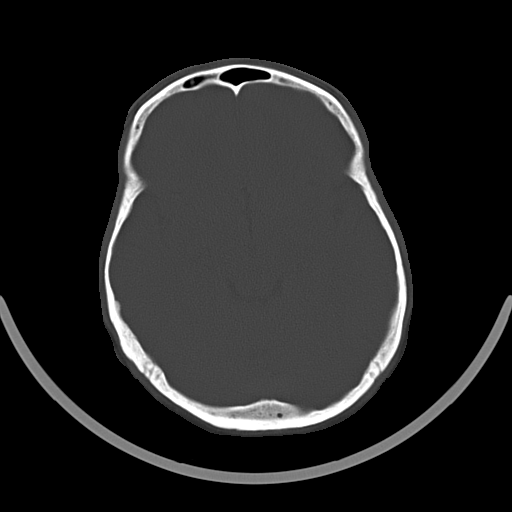

[16 of 30 positions shown; findings below may reference images not displayed]

FINDINGS: The ventricles are normal in size and configuration. There is no
intracranial mass, hemorrhage, extra-axial fluid collection, or
midline shift. Gray-white compartments normal. There is no
demonstrable acute infarct. The bony calvarium appears intact. The
mastoid air cells are clear.
IMPRESSION: Study within normal limits.

## 2016-01-05 DIAGNOSIS — K080 Exfoliation of teeth due to systemic causes: Secondary | ICD-10-CM | POA: Diagnosis not present

## 2016-03-01 DIAGNOSIS — K080 Exfoliation of teeth due to systemic causes: Secondary | ICD-10-CM | POA: Diagnosis not present

## 2016-11-20 DIAGNOSIS — N4889 Other specified disorders of penis: Secondary | ICD-10-CM | POA: Diagnosis not present

## 2016-12-05 DIAGNOSIS — Z111 Encounter for screening for respiratory tuberculosis: Secondary | ICD-10-CM | POA: Diagnosis not present

## 2017-05-18 DIAGNOSIS — L237 Allergic contact dermatitis due to plants, except food: Secondary | ICD-10-CM | POA: Diagnosis not present

## 2017-08-01 DIAGNOSIS — L309 Dermatitis, unspecified: Secondary | ICD-10-CM | POA: Diagnosis not present

## 2018-03-28 DIAGNOSIS — L237 Allergic contact dermatitis due to plants, except food: Secondary | ICD-10-CM | POA: Diagnosis not present

## 2018-06-12 DIAGNOSIS — K080 Exfoliation of teeth due to systemic causes: Secondary | ICD-10-CM | POA: Diagnosis not present

## 2018-12-13 DIAGNOSIS — L509 Urticaria, unspecified: Secondary | ICD-10-CM | POA: Diagnosis not present

## 2019-07-10 DIAGNOSIS — Z20828 Contact with and (suspected) exposure to other viral communicable diseases: Secondary | ICD-10-CM | POA: Diagnosis not present

## 2019-07-10 DIAGNOSIS — Z03818 Encounter for observation for suspected exposure to other biological agents ruled out: Secondary | ICD-10-CM | POA: Diagnosis not present
